# Patient Record
Sex: Female | Born: 1995 | Race: Black or African American | Hispanic: No | Marital: Single | Smoking: Never smoker
Health system: Southern US, Community
[De-identification: ages and names within clinical notes are randomized; demographics above are authoritative.]

## PROBLEM LIST (undated history)

## (undated) DIAGNOSIS — G43909 Migraine, unspecified, not intractable, without status migrainosus: Secondary | ICD-10-CM

## (undated) DIAGNOSIS — H9325 Central auditory processing disorder: Secondary | ICD-10-CM

## (undated) HISTORY — DX: Central auditory processing disorder: H93.25

## (undated) HISTORY — DX: Migraine, unspecified, not intractable, without status migrainosus: G43.909

## (undated) HISTORY — PX: NO PAST SURGERIES: SHX2092

---
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
## 2016-11-15 ENCOUNTER — Encounter: Payer: Self-pay | Admitting: Nurse Practitioner

## 2016-11-15 ENCOUNTER — Ambulatory Visit (INDEPENDENT_AMBULATORY_CARE_PROVIDER_SITE_OTHER): Payer: Self-pay | Admitting: Nurse Practitioner

## 2016-11-15 VITALS — Temp 98.5°F | Ht 65.25 in | Wt 163.0 lb

## 2016-11-15 DIAGNOSIS — M2669 Other specified disorders of temporomandibular joint: Secondary | ICD-10-CM

## 2016-11-15 NOTE — Progress Notes (Signed)
Subjective:  Presents for complaints of left ear pain that began 4 days ago. No fever or drainage. No cough runny nose headache or sore throat. Does have occasional popping in the left jaw area at times especially with yawning.  Objective:   Temp 98.5 F (36.9 C) (Oral)   Ht 5' 5.25" (1.657 m)   Wt 163 lb (73.9 kg)   BMI 26.92 kg/m  Both TMs have mild clear effusion, no erythema. No drainage in the left ear canal. Mild tenderness with palpation of the left TMJ area, no active popping with opening and closing of the mouth.  Assessment:  TMJ inflammation    Plan:    OTC anti-inflammatory as directed. OTC antihistamine and steroid nasal spray as directed. Given information on TMJ. Call back if worsens or persists.

## 2016-11-15 NOTE — Patient Instructions (Addendum)
OTC antihistamine   Temporomandibular Joint Syndrome Temporomandibular joint (TMJ) syndrome is a condition that affects the joints between your jaw and your skull. The TMJs are located near your ears and allow your jaw to open and close. These joints and the nearby muscles are involved in all movements of the jaw. People with TMJ syndrome have pain in the area of these joints and muscles. Chewing, biting, or other movements of the jaw can be difficult or painful. TMJ syndrome can be caused by various things. In many cases, the condition is mild and goes away within a few weeks. For some people, the condition can become a long-term problem. What are the causes? Possible causes of TMJ syndrome include:  Grinding your teeth or clenching your jaw. Some people do this when they are under stress.  Arthritis.  Injury to the jaw.  Head or neck injury.  Teeth or dentures that are not aligned well. In some cases, the cause of TMJ syndrome may not be known. What are the signs or symptoms? The most common symptom is an aching pain on the side of the head in the area of the TMJ. Other symptoms may include:  Pain when moving your jaw, such as when chewing or biting.  Being unable to open your jaw all the way.  Making a clicking sound when you open your mouth.  Headache.  Earache.  Neck or shoulder pain. How is this diagnosed? Diagnosis can usually be made based on your symptoms, your medical history, and a physical exam. Your health care provider may check the range of motion of your jaw. Imaging tests, such as X-rays or an MRI, are sometimes done. You may need to see your dentist to determine if your teeth and jaw are lined up correctly. How is this treated? TMJ syndrome often goes away on its own. If treatment is needed, the options may include:  Eating soft foods and applying ice or heat.  Medicines to relieve pain or inflammation.  Medicines to relax the muscles.  A splint, bite  plate, or mouthpiece to prevent teeth grinding or jaw clenching.  Relaxation techniques or counseling to help reduce stress.  Transcutaneous electrical nerve stimulation (TENS). This helps to relieve pain by applying an electrical current through the skin.  Acupuncture. This is sometimes helpful to relieve pain.  Jaw surgery. This is rarely needed. Follow these instructions at home:  Take medicines only as directed by your health care provider.  Eat a soft diet if you are having trouble chewing.  Apply ice to the painful area.  Put ice in a plastic bag.  Place a towel between your skin and the bag.  Leave the ice on for 20 minutes, 2-3 times a day.  Apply a warm compress to the painful area as directed.  Massage your jaw area and perform any jaw stretching exercises as recommended by your health care provider.  If you were given a mouthpiece or bite plate, wear it as directed.  Avoid foods that require a lot of chewing. Do not chew gum.  Keep all follow-up visits as directed by your health care provider. This is important. Contact a health care provider if:  You are having trouble eating.  You have new or worsening symptoms. Get help right away if:  Your jaw locks open or closed. This information is not intended to replace advice given to you by your health care provider. Make sure you discuss any questions you have with your health care provider.  Document Released: 04/02/2001 Document Revised: 03/07/2016 Document Reviewed: 02/10/2014 Elsevier Interactive Patient Education  2017 Elsevier Inc.  Steroid nasal spray Ibuprofen or Aleve

## 2019-08-18 ENCOUNTER — Encounter: Payer: Self-pay | Admitting: Family Medicine

## 2019-08-19 ENCOUNTER — Encounter: Payer: Self-pay | Admitting: Family Medicine

## 2020-12-21 ENCOUNTER — Ambulatory Visit
Admission: RE | Admit: 2020-12-21 | Discharge: 2020-12-21 | Disposition: A | Discharge status: Home / Self Care | Payer: Self-pay | Source: Ambulatory Visit

## 2020-12-21 ENCOUNTER — Other Ambulatory Visit: Payer: Self-pay

## 2020-12-21 VITALS — BP 130/78 | HR 96 | Temp 99.2°F | Resp 16

## 2020-12-21 DIAGNOSIS — S8391XA Sprain of unspecified site of right knee, initial encounter: Secondary | ICD-10-CM

## 2020-12-21 MED ORDER — IBUPROFEN 800 MG PO TABS: 800.0000 mg | ORAL_TABLET | Freq: Three times a day (TID) | ORAL | 0 refills | Status: DC

## 2020-12-21 NOTE — Discharge Instructions (Signed)
Ibuprofen and Tylenol for pain Ice and elevate Wear knee brace pressure support Monitor for gradual resolution of the next week Follow-up with sports medicine if persisting

## 2020-12-21 NOTE — ED Triage Notes (Signed)
Patient presents to Urgent Care with complaints of a fall of a ladder Friday. She states she heard a popping sound in right knee. She states pain increases with movement and when adding weight to right leg. She states pain has not resolved and concerned with possible fracture. Treating pain with ibuprofen and muscle relaxer. She has also applied a knee brace.

## 2020-12-21 NOTE — ED Provider Notes (Signed)
[facility]    CSN: [id number] Arrival date & time: 12/21/20  1648      History   Chief Complaint Chief Complaint  Patient presents with  . Leg Injury    Right     HPI Chelsea Serrano is a 25 y.o. female history of migraines presenting today for evaluation of right leg injury.  Reports 1 week ago fell off of a ladder, felt as if her knee dislocated and then relocated.  Since she has had continued discomfort, but has felt symptoms have been improving with wearing brace resting icing and anti-inflammatories.  She does report prior history of sprains.  Denies any repeat sensations of instability, popping catching or tearing.  HPI  Past Medical History:  Diagnosis Date  . Auditory processing disorder    central auditory processing disorder  . Migraine     Patient Active Problem List   Diagnosis Date Noted  . 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 CLOS FRACTURE MID/PROXIMAL PHALANX/PHALANG HAND 06/06/2010    History reviewed. No pertinent surgical history.  OB History   No obstetric history on file.      Home Medications    Prior to Admission medications   Medication Sig Start Date End Date Taking? Authorizing Provider  ibuprofen (ADVIL) 800 MG tablet Take 1 tablet (800 mg total) by mouth 3 (three) times daily. 12/21/20  Yes Lynleigh Kovack C, PA-C  neomycin-polymyxin-hydrocortisone (CORTISPORIN) 3.5-10000-1 otic suspension Place 4 drops into the left ear 3 (three) times daily. For 5 days Patient not taking: No sig reported 11/21/15   Merlyn Albert, MD  SPRINTEC 28 0.25-35 MG-MCG tablet Take 1 tablet by mouth daily. 10/30/20   [provider]    Family History History reviewed. No pertinent family history.  Social History Social History   Tobacco Use  . Smoking status: Never Smoker  . Smokeless tobacco: Never Used  Substance Use Topics  . Alcohol use: No  . Drug use: No     Allergies   Patient has no known allergies.   Review of  Systems Review of Systems  Constitutional: Negative for fatigue and fever.  HENT: Negative for mouth sores.   Eyes: Negative for visual disturbance.  Respiratory: Negative for shortness of breath.   Cardiovascular: Negative for chest pain.  Gastrointestinal: Negative for abdominal pain, nausea and vomiting.  Genitourinary: Negative for genital sores.  Musculoskeletal: Positive for arthralgias and joint swelling.  Skin: Negative for color change, rash and wound.  Neurological: Negative for dizziness, weakness, light-headedness and headaches.     Physical Exam Triage Vital Signs ED Triage Vitals  Enc Vitals Group     BP      Pulse      Resp      Temp      Temp src      SpO2      Weight      Height      Head Circumference      Peak Flow      Pain Score      Pain Loc      Pain Edu?      Excl. in GC?    No data found.  Updated Vital Signs BP 130/78 (BP Location: Left Arm)   Pulse 96   Temp 99.2 F (37.3 C) (Oral)   Resp 16   LMP  (Within Weeks) Comment: Period about 3 weeks ago   SpO2 97%   Visual Acuity Right Eye Distance:   Left Eye  Distance:   Bilateral Distance:    Right Eye Near:   Left Eye Near:    Bilateral Near:     Physical Exam Vitals and nursing note reviewed.  Constitutional:      Appearance: She is well-developed.     Comments: No acute distress  HENT:     Head: Normocephalic and atraumatic.     Nose: Nose normal.  Eyes:     Conjunctiva/sclera: Conjunctivae normal.  Cardiovascular:     Rate and Rhythm: Normal rate.  Pulmonary:     Effort: Pulmonary effort is normal. No respiratory distress.  Abdominal:     General: There is no distension.  Musculoskeletal:        General: Normal range of motion.     Cervical back: Neck supple.     Comments: Right knee, suprapatellar swelling noted, tender to palpation in infrapatellar area, nontender to medial and lateral joint line, nontender directly over patella, no pain with manipulation of patella,  full active range of motion, no crepitus palpated, no laxity appreciated with varus valgus stress, negative Lachman's, negative McMurray's  Skin:    General: Skin is warm and dry.  Neurological:     Mental Status: She is alert and oriented to person, place, and time.      UC Treatments / Results  Labs (all labs ordered are listed, but only abnormal results are displayed) Labs Reviewed - No data to display  EKG   Radiology No results found.  Procedures Procedures (including critical care time)  Medications Ordered in UC Medications - No data to display  Initial Impression / Assessment and Plan / UC Course  I have reviewed the triage vital signs and the nursing notes.  Pertinent labs & imaging results that were available during my care of the patient were reviewed by me and considered in my medical decision making (see chart for details).     Knee injury 1 week out from planting and twisting injury, did express concern for possible underlying soft tissue injury given mechanism of injury, offered x-ray, patient opted to defer as she has noticed some improvement and has low suspicion of fracture.  We will continue conservative treatment per patient request and continued monitoring.  Encouraged follow-up with sports medicine if symptoms persisting.  Work note provided.   Discussed strict return precautions. Patient verbalized understanding and is agreeable with plan.  Final Clinical Impressions(s) / UC Diagnoses   Final diagnoses:  Sprain of right knee, unspecified ligament, initial encounter     Discharge Instructions     Ibuprofen and Tylenol for pain Ice and elevate Wear knee brace pressure support Monitor for gradual resolution of the next week Follow-up with sports medicine if persisting    ED Prescriptions    Medication Sig Dispense Auth. Provider   ibuprofen (ADVIL) 800 MG tablet Take 1 tablet (800 mg total) by mouth 3 (three) times daily. 21 tablet Aqsa Sensabaugh,  [place] C, PA-C     PDMP not reviewed this encounter.   Lew Dawes, [place] 12/21/20 [phone]

## 2021-01-03 ENCOUNTER — Other Ambulatory Visit: Payer: Self-pay

## 2021-01-03 ENCOUNTER — Ambulatory Visit (INDEPENDENT_AMBULATORY_CARE_PROVIDER_SITE_OTHER): Payer: Self-pay | Admitting: Family Medicine

## 2021-01-03 DIAGNOSIS — M25561 Pain in right knee: Secondary | ICD-10-CM

## 2021-01-03 NOTE — Progress Notes (Signed)
    SUBJECTIVE:   CHIEF COMPLAINT / HPI:   Right Knee Pain Chelsea Serrano is a very pleasant 24y/o female who presents today with right knee pain. She states she was coming down a ladder at work, lost her footing and stepped funny and twisted her knee. She did not have immediate pain and could walk on it at first. Later that day she began having knee pain and felt like it "dislocated". This occurred about 3 weeks ago. She feels pain if she stands on it for long periods of time and it gets sore. She states when she ices it it feels stiff and hard to move. She denies any locking, catching, or popping sensations. She also has pain with knee extension. Overall, she feels like it is getting better. She takes Ibuprofen and it does help her but runs out.  PERTINENT  PMH / PSH: Hx of patellar instability 2012  OBJECTIVE:   BP 110/72   Ht 5\' 5"  (1.651 m)   Wt 145 lb (65.8 kg)   BMI 24.13 kg/m   No flowsheet data found.  Knee, Right: Inspection was negative for erythema, ecchymosis, and positive for mild swelling above the patella. No obvious bony abnormalities or signs of osteophyte development. Palpation yielded no asymmetric warmth; No joint line tenderness; No condyle tenderness; Positive patellar tenderness at insertion on tibia; No patellar crepitus. Patellar and quadriceps tendons unremarkable, and no tenderness of the pes anserine bursa. ROM normal in flexion (135 degrees) and extension (0 degrees). Normal hamstring and quadriceps strength. Neurovascularly intact bilaterally. Special Tests  - Cruciate Ligaments:   - Anterior Drawer:  NEG - Posterior Drawer: NEG   - Lachman:  NEG  - Collateral Ligaments:   - Varus/Valgus Stress test: NEG  - Meniscus:   - Thessaly: NEG   - McMurray's: NEG  - Patella:   - Patellar grind/compression: NEG   - Patellar glide: With apprehension   Limited MSK U/S Right Knee No suprapatellar pouch effusion. Quad tendon intact with no hypoechoic changes.  Patella tendon intact with no hypoechoic changes, no inflammation of the prepatellar bursa. No medial or lateral changes suggesting meniscal tear. Impression: Normal U/S of the right knee  ASSESSMENT/PLAN:   Right knee pain Given history, presentation, exam, and ultrasound today she most likely has suffered from patella subluxation. She also has some TTP at the insertion of the patellar tendon suggesting mild patellar tendonitis. - Referral to formal PT to learn HEP  - Cont Ibuprofen 200mg  3-4 tablets 3 times per day as needed for pain - Knee compression sleeve when at work and active - Work restrictions until f/u in 3 weeks: no climibing ladders, infrequent bending, stopping, or squatting. No lifting over 20lbs - F/u in 3 weeks and if little to no improvement obtain knee x-rays; 4 view (AP, Lateral, Sunrise, and )     , DO PGY-4, Sports Medicine Fellow [facility]  Addendum:  I was the preceptor for this visit and available for immediate consultation.  Arlyce Harman MD [facility]

## 2021-01-03 NOTE — Patient Instructions (Addendum)
It was great to meet you today! Thank you for letting me participate in your care!  Today, we discussed your right knee pain which is due to you having suffered from a patella subluxation. Please attend at least one to two physical therapy session to learn proper exercises to allow this to get better. While you are at work or active please wear a knee sleeve/brace. You can continue taking Ibuprofen 200mg  for pain as needed (3-4 tablets 3 times per day) and you can also use Voltaren Gel as needed. Please return in 3 weeks so we can check on your progress.  Patellar Dislocation and Subluxation The kneecap (patella) is located in a groove at the end of the thigh bone (femur). Patellar dislocation and patellar subluxation are injuries that happen when the patella slips out of its normal position. In a patellar subluxation, the patella slips partly out of the groove. In a patellar dislocation, the patella slips all the way out of the groove. What are the causes? This condition may be caused by: A hit to the knee. Twisting the knee when the foot is planted. What increases the risk? You are more likely to develop this condition if you: Are an athlete in your teens or 81s. Have had this condition before. Play certain kinds of sports, including sports that: Include quick turns, quick changes in direction, or contact with other players, like soccer. Require jumping, such as basketball or volleyball. Require that cleats are worn. What are the signs or symptoms? Symptoms of this condition include: A feeling that the knee is buckling, followed by sudden extreme pain in the knee. A deformed knee. A popping sensation, followed by a feeling that something is out of place. Inability to bend or straighten the knee. Swelling in the knee. How is this diagnosed? This condition may be diagnosed with: A physical exam. An X-ray. This may be done to see the position of the patella or to see if a bone has  broken. An MRI. This may be done to look at the alignment of your knee and the ligaments that hold your patella in place. How is this treated? Your patella may move back into place on its own when you straighten your knee. If your patella does not move back into place on its own, your health careprovider will move it back into place. After your patella is back in its normal position, you may be able to go back to your normal activities, or you may be treated further by: Wearing a knee brace to keep your knee from moving (immobilized) while it heals. Doing exercises that help improve strength and movement in your knee. Taking medicine to help with pain and inflammation. Applying ice to the knee to help with pain and inflammation. Having surgery to prevent the patella from slipping out of place or to clean out any loose cartilage in your joint. This may be needed if other treatments do not help or if the condition keeps happening. Follow these instructions at home: If you have a brace: Wear the brace as told by your health care provider. Remove it only as told by your health care provider. Loosen the brace if your toes tingle, become numb, or turn cold and blue. Keep the brace clean. If the brace is not waterproof: Do not let it get wet. Cover it with a watertight covering when you take a bath or shower. Managing pain, stiffness, and swelling  If directed, put ice on the affected area. If  you have a removable brace, remove it as told by your health care provider. Put ice in a plastic bag. Place a towel between your skin and the bag. Leave the ice on for 20 minutes, 2-3 times a day. Move your toes often to reduce stiffness and swelling. Raise (elevate) the injured area above the level of your heart while you are sitting or lying down.  Activity Do not use the injured limb to support your body weight until your health care provider says that you can. Use crutches as told by your health care  provider. Return to your normal activities as told by your health care provider. Ask your health care provider what activities are safe for you. Do exercises as told by your health care provider. General instructions Take over-the-counter and prescription medicines only as told by your health care provider. Do not use any products that contain nicotine or tobacco, such as cigarettes, e-cigarettes, and chewing tobacco. These can delay healing. If you need help quitting, ask your health care provider. Keep all follow-up visits as told by your health care provider. This is important. How is this prevented? Warm up and stretch before being active. Cool down and stretch after being active. Give your body time to rest between periods of activity. Make sure to use equipment that fits you. Be safe and responsible while being active. This will help you avoid falls. Do at least 150 minutes of moderate-intensity exercise each week, such as brisk walking or water aerobics. Maintain physical fitness, including: Strength. Flexibility. Cardiovascular fitness. Endurance. Contact a health care provider if: The pain in your knee gets worse and is not relieved by medicine. Your knee catches or locks. Get help right away if: Your patella slips out of its normal position again. The swelling in your knee gets worse. Summary Patellar dislocation and patellar subluxation are injuries that happen when the patella slips out of its normal position. If your patella does not move back into place on its own, your health care provider will move it back into place. Return to your normal activities as told by your health care provider. Ask your health care provider what activities are safe for you. Do not use the injured limb to support your body weight until your health care provider says that you can. Use crutches as told by your health care provider. Keep all follow-up visits as told by your health care provider.  This is important. This information is not intended to replace advice given to you by your health care provider. Make sure you discuss any questions you have with your healthcare provider. Document Revised: 11/03/2018 Document Reviewed: 06/01/2018 Elsevier Patient Education  2022 [employer].   Be well, Jules Schick, DO PGY-4, Sports Medicine Fellow [facility]

## 2021-01-03 NOTE — Assessment & Plan Note (Signed)
Given history, presentation, exam, and ultrasound today she most likely has suffered from patella subluxation. She also has some TTP at the insertion of the patellar tendon suggesting mild patellar tendonitis. - Referral to formal PT to learn HEP  - Cont Ibuprofen 200mg  3-4 tablets 3 times per day as needed for pain - Knee compression sleeve when at work and active - Work restrictions until f/u in 3 weeks: no climibing ladders, infrequent bending, stopping, or squatting. No lifting over 20lbs - F/u in 3 weeks and if little to no improvement obtain knee x-rays; 4 view (AP, Lateral, Sunrise, and [place])

## 2021-01-24 ENCOUNTER — Ambulatory Visit (INDEPENDENT_AMBULATORY_CARE_PROVIDER_SITE_OTHER): Payer: Self-pay | Admitting: Family Medicine

## 2021-01-24 ENCOUNTER — Other Ambulatory Visit: Payer: Self-pay

## 2021-01-24 VITALS — BP 102/62 | Ht 65.0 in | Wt 145.0 lb

## 2021-01-24 DIAGNOSIS — M25561 Pain in right knee: Secondary | ICD-10-CM

## 2021-01-24 NOTE — Assessment & Plan Note (Addendum)
Improving, but continues to have pain and instability.  She is improving with PT and rest, but does not feel that she is ready to return to full activity at work.  Will continue to have her use compression sleeve, ibuprofen PRN, can transition to home exercises when she is comfortable.  Continue modified working.  F/U 1 month or sooner as needed.

## 2021-01-24 NOTE — Patient Instructions (Signed)
Thank you for coming to see me today. It was a pleasure. Today we talked about:   Continue using your brace.  When you feel comfortable doing your exercises at home, you can stop going to PT.  Continue to use ibuprofen as needed.  Please follow-up with [place] in 1 month or sooner as needed.  If you have any questions or concerns, please do not hesitate to call the office.  Best,   Luis Abed, DO

## 2021-01-24 NOTE — Progress Notes (Signed)
   Chelsea Serrano is a 25 y.o. female who presents to [facility] today for the following:  Follow-up right knee pain Last seen on 6/15, diagnosed with patellar subluxation Referred to PT advised to use ibuprofen with knee compression sleeve Has been going to PT starting last week for 3 times and states that is helping some She reports that her stiffness is improved Does state that she still has pain at the inferior aspect of her patella over her patellar tendon but this is improving She has had work restrictions and has just been sitting now, which does not cause her any pain Has been taking ibuprofen 600 mg, but only requires this when she has pain which is every few days She is also icing Reports that she has some swelling after PT Has not reinjured Does still feel that she has some instability of her knee but has been wearing her brace daily   PMH reviewed.  ROS as above. Medications reviewed.  Exam:  BP 102/62   Ht 5\' 5"  (1.651 m)   Wt 145 lb (65.8 kg)   BMI 24.13 kg/m  Gen: Well NAD MSK:  Right Knee: - Inspection: no gross deformity b/l. No swelling/effusion, erythema or bruising b/l. Skin intact - Palpation: TTP along patellar tendon - ROM: full active ROM with flexion and extension in knee and hip b/l - Strength: 5/5 strength b/l - Neuro/vasc: NV intact - Special Tests: - LIGAMENTS: negative Lachman's, no MCL or LCL laxity  -- MENISCUS: negative McMurray's -- PF JOINT: increased patellar mobility on right, normal patellar mobility on left.  negative patellar grind, positive patellar apprehension on right  Hips: normal ROM b/l   No results found.   Assessment and Plan: 1) Acute pain of right knee Improving, but continues to have pain and instability.  She is improving with PT and rest, but does not feel that she is ready to return to full activity at work.  Will continue to have her use compression sleeve, ibuprofen PRN, can transition to home exercises when she is  comfortable.  Continue modified working.  F/U 1 month or sooner as needed.   , D.O.  PGY-4 [facility]  01/24/2021 2:19 PM

## 2021-01-25 ENCOUNTER — Encounter: Payer: Self-pay | Admitting: Family Medicine

## 2021-02-26 ENCOUNTER — Ambulatory Visit: Payer: Medicaid Other | Admitting: Family Medicine

## 2021-03-09 ENCOUNTER — Other Ambulatory Visit: Payer: Self-pay

## 2021-03-09 ENCOUNTER — Ambulatory Visit (INDEPENDENT_AMBULATORY_CARE_PROVIDER_SITE_OTHER): Payer: Self-pay | Admitting: Family Medicine

## 2021-03-09 VITALS — Ht 65.0 in | Wt 145.0 lb

## 2021-03-09 DIAGNOSIS — M2142 Flat foot [pes planus] (acquired), left foot: Secondary | ICD-10-CM

## 2021-03-09 DIAGNOSIS — M2140 Flat foot [pes planus] (acquired), unspecified foot: Secondary | ICD-10-CM

## 2021-03-09 DIAGNOSIS — M2141 Flat foot [pes planus] (acquired), right foot: Secondary | ICD-10-CM

## 2021-03-09 DIAGNOSIS — M25561 Pain in right knee: Secondary | ICD-10-CM

## 2021-03-09 HISTORY — DX: Flat foot (pes planus) (acquired), unspecified foot: M21.40

## 2021-03-09 NOTE — Patient Instructions (Signed)
Thank you for coming to see me today. It was a pleasure. Today we talked about:   We will give you strengthening exercises to continue.  We will have you continue to wear your brace at work as needed.  You can try the insoles and see if these help.  Please follow-up with [place] in 6-8 weeks as needed.  If you have any questions or concerns, please do not hesitate to call the office at [phone].  Best,   Luis Abed, DO [facility]

## 2021-03-09 NOTE — Progress Notes (Signed)
[facility]: Attending Note: I have reviewed the chart, discussed wit the Sports Medicine Fellow. I agree with assessment and treatment plan as detailed in the Fellow's note. I am glad she discontinued her exercises.  She still has limited quadricep development so I would add short arc quadricep exercises and we have given her handout for that.  She can follow-up as needed.

## 2021-03-09 NOTE — Progress Notes (Signed)
   Chelsea Serrano is a 24 y.o. female who presents to [facility] today for the following:  Right knee pain follow-up Last seen on 7/6 for the same Previously diagnosed with patellar subluxation, initial injury occurred in May She has been discharged from physical therapy and is performing home exercises Overall she is doing well She reports that she has been doing her home exercises and feels that they are going well She states that she does not have much pain other than when she is at work and standing for long period time Still has some soreness around her right kneecap She states that she does have pain in her feet when she is standing for long periods of time and feels that she has not found shoes that help both her knees and her feet  PMH reviewed.  ROS as above. Medications reviewed.  Exam:  Ht 5\' 5"  (1.651 m)   Wt 145 lb (65.8 kg)   BMI 24.13 kg/m  Gen: Well NAD MSK:  Right Knee: - Inspection: no gross deformity b/l. No swelling/effusion, erythema or bruising b/l. Skin intact - Palpation: no TTP b/l - ROM: full active ROM with flexion and extension in knee and hip b/l.  - Strength: 5/5 strength b/l - Neuro/vasc: NV intact distally b/l - Special Tests: - LIGAMENTS: negative anterior and posterior drawer, negative Lachman's, no MCL or LCL laxity  -- MENISCUS: negative McMurray's -- PF JOINT: Slight hypermobility of right patella compared to left patella, no pain.  equivocal patellar grind on right, negative patellar apprehension  Hips: normal ROM  Feet: Bilateral pes planus, no navicular drop   No results found.   Assessment and Plan: 1) Acute pain of right knee Overall symptoms are improving.  She has excellent strength on her examination.  We will have her continue performing exercises and also add short arc quad exercises.  She can continue to wear her brace as needed while she is working.  She is no longer wearing it at home and feels okay with this.  Can have her  follow-up here in 6 to 8 weeks as needed.  Flat foot Given her pes planus on exam and report of foot pain previously, discussed sports insoles with scaphoid padding which she opted to proceed with.  She was given size 9-10 women's Hapad with small scaphoid pads.  She was able to ambulate in the hallway with good alignment.  Discussed that she could return for further changes if needed or come back for custom orthotics if desires.  Can follow-up as needed.   , D.O.  PGY-4 [facility]  03/09/2021 11:56 AM

## 2021-03-09 NOTE — Assessment & Plan Note (Signed)
Given her pes planus on exam and report of foot pain previously, discussed sports insoles with scaphoid padding which she opted to proceed with.  She was given size 9-10 women's Hapad with small scaphoid pads.  She was able to ambulate in the hallway with good alignment.  Discussed that she could return for further changes if needed or come back for custom orthotics if desires.  Can follow-up as needed.

## 2021-03-09 NOTE — Assessment & Plan Note (Signed)
Overall symptoms are improving.  She has excellent strength on her examination.  We will have her continue performing exercises and also add short arc quad exercises.  She can continue to wear her brace as needed while she is working.  She is no longer wearing it at home and feels okay with this.  Can have her follow-up here in 6 to 8 weeks as needed.

## 2021-03-31 ENCOUNTER — Encounter: Payer: Self-pay | Admitting: Emergency Medicine

## 2021-03-31 ENCOUNTER — Ambulatory Visit
Admission: EM | Admit: 2021-03-31 | Discharge: 2021-03-31 | Disposition: A | Discharge status: Home / Self Care | Payer: Medicaid Other | Attending: Emergency Medicine | Admitting: Emergency Medicine

## 2021-03-31 DIAGNOSIS — N898 Other specified noninflammatory disorders of vagina: Secondary | ICD-10-CM | POA: Insufficient documentation

## 2021-03-31 MED ORDER — VALACYCLOVIR HCL 1 G PO TABS: 1000.0000 mg | ORAL_TABLET | Freq: Two times a day (BID) | ORAL | 0 refills | Status: DC

## 2021-03-31 NOTE — Discharge Instructions (Signed)
HSV swab obtained Vaginal self-swab obtained.   We will follow up with you regarding abnormal results HIV/ syphilis testing today Valtrex prescribed.  Take as directed and to completion If tests results are positive, please abstain from sexual activity until you and your partner(s) have been treated Follow up with PCP for recheck and to ensure symptoms are improving Return here or go to ER if you have any new or worsening symptoms fever, chills, nausea, vomiting, abdominal or pelvic pain, painful intercourse, vaginal discharge, vaginal bleeding, persistent symptoms despite treatment, etc..Marland Kitchen

## 2021-03-31 NOTE — ED Provider Notes (Signed)
[facility]   [id number] 03/31/21 Arrival Time: 5784   ON:GEXBMWU sore  SUBJECTIVE:  Chelsea Serrano is a 25 y.o. female who presents with complaints of vaginal sore x 3 days.  Last sex 9-10 days ago with new partner.  Used a condom.  Partner without symptoms.  She reports similar symptoms in the past and diagnosed with "clogged duct."  Denies hx of STDs.  She denies fever, chills, nausea, vomiting, abdominal or pelvic pain, urinary symptoms, vaginal itching, vaginal odor, vaginal bleeding, dyspareunia.  Patient's last menstrual period was 03/21/2021 (exact date).   ROS: As per HPI.  All other pertinent ROS negative.     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   Migraine    History reviewed. No pertinent surgical history. No Known Allergies No current facility-administered medications on file prior to encounter.   Current Outpatient Medications on File Prior to Encounter  Medication Sig Dispense Refill   ibuprofen (ADVIL) 800 MG tablet Take 1 tablet (800 mg total) by mouth 3 (three) times daily. 21 tablet 0   neomycin-polymyxin-hydrocortisone (CORTISPORIN) 3.5-10000-1 otic suspension Place 4 drops into the left ear 3 (three) times daily. For 5 days (Patient not taking: No sig reported) 10 mL 0   SPRINTEC 28 0.25-35 MG-MCG tablet Take 1 tablet by mouth daily.      Social History   Socioeconomic History   Marital status: Single    Spouse name: Not on file   Number of children: Not on file   Years of education: Not on file   Highest education level: Not on file  Occupational History   Not on file  Tobacco Use   Smoking status: Never   Smokeless tobacco: Never  Substance and Sexual Activity   Alcohol use: No   Drug use: No   Sexual activity: Not on file  Other Topics Concern   Not on file  Social History Narrative   Not on file   Social Determinants of Health   Financial Resource  Strain: Not on file  Food Insecurity: Not on file  Transportation Needs: Not on file  Physical Activity: Not on file  Stress: Not on file  Social Connections: Not on file  Intimate Partner Violence: Not on file   No family history on file.  OBJECTIVE:  Vitals:   03/31/21 0835  BP: 124/79  Pulse: 90  Resp: 17  Temp: 97.6 F (36.4 C)  TempSrc: Temporal  SpO2: 98%     General appearance: Alert, NAD, appears stated age Head: NCAT Eyes: EOMI grossly Lungs: normal respiratory effort GU: External examination positive for yellow lesion with erythematous outline on left labia minora, mildly TTP, no obvious discharge Skin: warm and dry Psychological:  Alert and cooperative. Normal mood and affect.   ASSESSMENT & PLAN:  1. Vaginal sore     Meds ordered this encounter  Medications   valACYclovir (VALTREX) 1000 MG tablet    Sig: Take 1 tablet (1,000 mg total) by mouth 2 (two) times daily for 10 days.    Dispense:  20 tablet    Refill:  0    Order Specific Question:   Supervising Provider    Answer:   Eustace Moore [[id number]]     Pending: Labs Reviewed  HSV CULTURE AND TYPING  RPR  HIV ANTIBODY (ROUTINE TESTING W REFLEX)  CERVICOVAGINAL ANCILLARY ONLY   HSV swab obtained Vaginal self-swab obtained.   We will  follow up with you regarding abnormal results HIV/ syphilis testing today Valtrex prescribed.  Take as directed and to completion If tests results are positive, please abstain from sexual activity until you and your partner(s) have been treated Follow up with PCP for recheck and to ensure symptoms are improving Return here or go to ER if you have any new or worsening symptoms fever, chills, nausea, vomiting, abdominal or pelvic pain, painful intercourse, vaginal discharge, vaginal bleeding, persistent symptoms despite treatment, etc...  Reviewed expectations re: course of current medical issues. Questions answered. Outlined signs and symptoms indicating need  for more acute intervention. Patient verbalized understanding. After Visit Summary given.        Rennis Harding, PA-C 03/31/21 [phone]

## 2021-03-31 NOTE — ED Triage Notes (Signed)
Pt states she had a bump come up on her LT labia a few days ago and now it has turned into a rash.  States it is painful to touch.

## 2021-04-01 LAB — HIV ANTIBODY (ROUTINE TESTING W REFLEX): HIV Screen 4th Generation wRfx: NONREACTIVE

## 2021-04-01 LAB — RPR: RPR Ser Ql: NONREACTIVE

## 2021-04-02 LAB — CERVICOVAGINAL ANCILLARY ONLY
Bacterial Vaginitis (gardnerella): NEGATIVE
Candida Glabrata: NEGATIVE
Candida Vaginitis: NEGATIVE
Chlamydia: NEGATIVE
Comment: NEGATIVE
Comment: NEGATIVE
Comment: NEGATIVE
Comment: NEGATIVE
Comment: NEGATIVE
Comment: NORMAL
Neisseria Gonorrhea: NEGATIVE
Trichomonas: NEGATIVE

## 2021-04-03 LAB — HSV CULTURE AND TYPING

## 2021-04-10 ENCOUNTER — Telehealth: Payer: Self-pay | Admitting: Emergency Medicine

## 2021-04-10 ENCOUNTER — Other Ambulatory Visit: Payer: Self-pay

## 2021-04-10 ENCOUNTER — Ambulatory Visit
Admission: RE | Admit: 2021-04-10 | Discharge: 2021-04-10 | Disposition: A | Discharge status: Home / Self Care | Payer: Medicaid Other | Source: Ambulatory Visit

## 2021-04-10 MED ORDER — VALACYCLOVIR HCL 1 G PO TABS: 1000.0000 mg | ORAL_TABLET | Freq: Two times a day (BID) | ORAL | 0 refills | Status: DC

## 2021-04-10 NOTE — Telephone Encounter (Signed)
Needs refill on hsv medications, has appt with primary care next week.

## 2021-04-10 NOTE — ED Triage Notes (Signed)
Pt needs refill on hsv meds

## 2021-04-20 ENCOUNTER — Other Ambulatory Visit: Payer: Self-pay

## 2021-04-20 ENCOUNTER — Encounter: Payer: Self-pay | Admitting: Nurse Practitioner

## 2021-04-20 ENCOUNTER — Ambulatory Visit: Payer: Self-pay | Admitting: Nurse Practitioner

## 2021-04-20 VITALS — BP 114/73 | HR 80 | Temp 98.5°F | Ht 65.0 in | Wt 145.0 lb

## 2021-04-20 DIAGNOSIS — B009 Herpesviral infection, unspecified: Secondary | ICD-10-CM

## 2021-04-20 DIAGNOSIS — Z0001 Encounter for general adult medical examination with abnormal findings: Secondary | ICD-10-CM | POA: Insufficient documentation

## 2021-04-20 DIAGNOSIS — Z7689 Persons encountering health services in other specified circumstances: Secondary | ICD-10-CM

## 2021-04-20 DIAGNOSIS — Z139 Encounter for screening, unspecified: Secondary | ICD-10-CM

## 2021-04-20 DIAGNOSIS — S3660XA Unspecified injury of rectum, initial encounter: Secondary | ICD-10-CM

## 2021-04-20 DIAGNOSIS — Z23 Encounter for immunization: Secondary | ICD-10-CM

## 2021-04-20 MED ORDER — ZINC OXIDE 40 % EX PSTE: PASTE | CUTANEOUS | 1 refills | Status: AC

## 2021-04-20 MED ORDER — VALACYCLOVIR HCL 1 G PO TABS: 1000.0000 mg | ORAL_TABLET | Freq: Two times a day (BID) | ORAL | 0 refills | Status: AC

## 2021-04-20 MED ORDER — VALACYCLOVIR HCL 500 MG PO TABS: 500.0000 mg | ORAL_TABLET | Freq: Two times a day (BID) | ORAL | 5 refills | Status: AC

## 2021-04-20 NOTE — Patient Instructions (Signed)
Please have fasting labs drawn 2-3 days prior to your appointment so we can discuss the results during your office visit.  

## 2021-04-20 NOTE — Progress Notes (Signed)
New Patient Office Visit  Subjective:  Patient ID: Chelsea Serrano, female    DOB: 1996/07/19  Age: 25 y.o. MRN: [id number]  CC:  Chief Complaint  Patient presents with   New Patient (Initial Visit)    Establish care recently diagnosed with herpes     HPI Chelsea Serrano presents for new patient visit. No recent PCP, physical, or labs  She was recently diagnosed with HSV-2, and she has sores in her her vagina that are doing better since starting valtrex, but she has some sores near her rectum that she states are getting worse.    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   CLOS FRACTURE MID/PROXIMAL PHALANX/PHALANG HAND 06/06/2010   Qualifier: Diagnosis of  By: Aline Brochure MD, Stanley     Flat foot 03/09/2021   Migraine     Past Surgical History:  Procedure Laterality Date   NO PAST SURGERIES      Family History  Problem Relation Age of Onset   Cancer Maternal Grandmother    Cancer Paternal Grandmother     Social History   Socioeconomic History   Marital status: Single    Spouse name: Not on file   Number of children: 0   Years of education: Not on file   Highest education level: Not on file  Occupational History   Occupation: Therapist, 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  Tobacco Use   Smoking status: Never   Smokeless tobacco: Never  Vaping Use   Vaping Use: Never used  Substance and Sexual Activity   Alcohol use: No   Drug use: No   Sexual activity: Yes    Birth control/protection: Pill  Other Topics Concern   Not on file  Social History Narrative   Not on file   Social Determinants of Health   Financial Resource Strain: Not on file  Food Insecurity: Not on file  Transportation Needs: Not on file  Physical Activity: Not on file  Stress: Not on file  Social Connections: Not on file  Intimate Partner Violence: Not on file    ROS Review of Systems  Constitutional: Negative.    Gastrointestinal:  Positive for rectal pain.  Genitourinary:  Positive for genital sores.       Genital sores improved since taking valtrex  Skin:  Positive for rash.   Objective:   Today's Vitals: BP 114/73 (BP Location: Right Arm, Patient Position: Sitting, Cuff Size: Normal)   Pulse 80   Temp 98.5 F (36.9 C) (Oral)   Ht 5' 5" (1.651 m)   Wt 145 lb 0.6 oz (65.8 kg)   LMP 03/21/2021 (Exact Date)   SpO2 97%   BMI 24.14 kg/m   Physical Exam Constitutional:      Appearance: Normal appearance.  Genitourinary:      Comments: Small rectal wound as marked in red above; likely a deroofed herpetic rash; pink woundbed, no sign of infection Neurological:     Mental Status: She is alert.    Assessment & Plan:   Problem List Items Addressed This Visit       Other   Establishing care with new doctor, encounter for - Primary    -no previous PCP/records      Relevant Orders   CBC with Differential/Platelet   CMP14+EGFR   Lipid Panel With LDL/HDL Ratio   Hepatitis C antibody   HSV-2 (herpes simplex virus 2) infection    -was recently  treated with valacyclovir x 10 days -she has anal sore at 1 o'clock that is healing, no sign of infection -recommend barrier cream like max strength desitin since she is having pain with BMs -Refill valtrex x1 week -if no improvement in 1 week, would consider surgical consult      Relevant Medications   valACYclovir (VALTREX) 1000 MG tablet   Other Visit Diagnoses     Injury of rectum without open wound into cavity, initial encounter       Relevant Medications   Zinc Oxide 40 % PSTE   Screening due       Relevant Orders   Hepatitis C antibody       Outpatient Encounter Medications as of 04/20/2021  Medication Sig   ibuprofen (ADVIL) 800 MG tablet Take 1 tablet (800 mg total) by mouth 3 (three) times daily.   SPRINTEC 28 0.25-35 MG-MCG tablet Take 1 tablet by mouth daily.   Zinc Oxide 40 % PSTE Apply to affected area up to TID AS  NEEDED   [DISCONTINUED] valACYclovir (VALTREX) 1000 MG tablet Take 1 tablet (1,000 mg total) by mouth 2 (two) times daily for 10 days.   valACYclovir (VALTREX) 1000 MG tablet Take 1 tablet (1,000 mg total) by mouth 2 (two) times daily for 7 days.   [DISCONTINUED] neomycin-polymyxin-hydrocortisone (CORTISPORIN) 3.5-10000-1 otic suspension Place 4 drops into the left ear 3 (three) times daily. For 5 days (Patient not taking: No sig reported)   No facility-administered encounter medications on file as of 04/20/2021.    Follow-up: Return in about 3 months (around 07/20/2021) for Physical Exam (PAP optional).   Noreene Larsson, NP

## 2021-04-20 NOTE — Addendum Note (Signed)
Addended by: Bjorn Pippin on: 04/20/2021 09:40 AM   Modules accepted: Orders

## 2021-04-20 NOTE — Assessment & Plan Note (Signed)
-  was recently treated with valacyclovir x 10 days -she has anal sore at 1 o'clock that is healing, no sign of infection -recommend barrier cream like max strength desitin since she is having pain with BMs -Refill valtrex x1 week -if no improvement in 1 week, would consider surgical consult

## 2021-04-20 NOTE — Assessment & Plan Note (Signed)
-  no previous PCP/records

## 2021-07-17 DIAGNOSIS — Z7689 Persons encountering health services in other specified circumstances: Secondary | ICD-10-CM | POA: Diagnosis not present

## 2021-07-17 DIAGNOSIS — Z139 Encounter for screening, unspecified: Secondary | ICD-10-CM | POA: Diagnosis not present

## 2021-07-18 ENCOUNTER — Other Ambulatory Visit (HOSPITAL_COMMUNITY)
Admission: RE | Admit: 2021-07-18 | Discharge: 2021-07-18 | Disposition: A | Discharge status: Home / Self Care | Payer: BC Managed Care – PPO | Source: Ambulatory Visit | Attending: Family Medicine | Admitting: Family Medicine

## 2021-07-18 ENCOUNTER — Other Ambulatory Visit: Payer: Self-pay

## 2021-07-18 ENCOUNTER — Ambulatory Visit (INDEPENDENT_AMBULATORY_CARE_PROVIDER_SITE_OTHER): Payer: BC Managed Care – PPO | Admitting: Nurse Practitioner

## 2021-07-18 ENCOUNTER — Encounter: Payer: Self-pay | Admitting: Nurse Practitioner

## 2021-07-18 VITALS — BP 126/84 | HR 85 | Ht 65.0 in | Wt 152.1 lb

## 2021-07-18 DIAGNOSIS — Z124 Encounter for screening for malignant neoplasm of cervix: Secondary | ICD-10-CM

## 2021-07-18 DIAGNOSIS — E785 Hyperlipidemia, unspecified: Secondary | ICD-10-CM | POA: Insufficient documentation

## 2021-07-18 DIAGNOSIS — H6120 Impacted cerumen, unspecified ear: Secondary | ICD-10-CM | POA: Insufficient documentation

## 2021-07-18 DIAGNOSIS — H6123 Impacted cerumen, bilateral: Secondary | ICD-10-CM | POA: Diagnosis not present

## 2021-07-18 DIAGNOSIS — Z23 Encounter for immunization: Secondary | ICD-10-CM | POA: Diagnosis not present

## 2021-07-18 DIAGNOSIS — Z0001 Encounter for general adult medical examination with abnormal findings: Secondary | ICD-10-CM

## 2021-07-18 DIAGNOSIS — E7800 Pure hypercholesterolemia, unspecified: Secondary | ICD-10-CM

## 2021-07-18 LAB — CBC WITH DIFFERENTIAL/PLATELET
Basophils Absolute: 0 10*3/uL (ref 0.0–0.2)
Basos: 1 %
EOS (ABSOLUTE): 0 10*3/uL (ref 0.0–0.4)
Eos: 0 %
Hematocrit: 40.4 % (ref 34.0–46.6)
Hemoglobin: 13.4 g/dL (ref 11.1–15.9)
Immature Grans (Abs): 0 10*3/uL (ref 0.0–0.1)
Immature Granulocytes: 0 %
Lymphocytes Absolute: 2 10*3/uL (ref 0.7–3.1)
Lymphs: 45 %
MCH: 28.5 pg (ref 26.6–33.0)
MCHC: 33.2 g/dL (ref 31.5–35.7)
MCV: 86 fL (ref 79–97)
Monocytes Absolute: 0.3 10*3/uL (ref 0.1–0.9)
Monocytes: 8 %
Neutrophils Absolute: 2.1 10*3/uL (ref 1.4–7.0)
Neutrophils: 46 %
Platelets: 154 10*3/uL (ref 150–450)
RBC: 4.7 x10E6/uL (ref 3.77–5.28)
RDW: 11.2 % — ABNORMAL LOW (ref 11.7–15.4)
WBC: 4.5 10*3/uL (ref 3.4–10.8)

## 2021-07-18 LAB — CMP14+EGFR
ALT: 7 IU/L (ref 0–32)
AST: 18 IU/L (ref 0–40)
Albumin/Globulin Ratio: 1.4 (ref 1.2–2.2)
Albumin: 4.5 g/dL (ref 3.9–5.0)
Alkaline Phosphatase: 46 IU/L (ref 44–121)
BUN/Creatinine Ratio: 13 (ref 9–23)
BUN: 9 mg/dL (ref 6–20)
Bilirubin Total: 1.4 mg/dL — ABNORMAL HIGH (ref 0.0–1.2)
CO2: 23 mmol/L (ref 20–29)
Calcium: 9.2 mg/dL (ref 8.7–10.2)
Chloride: 104 mmol/L (ref 96–106)
Creatinine, Ser: 0.71 mg/dL (ref 0.57–1.00)
Globulin, Total: 3.2 g/dL (ref 1.5–4.5)
Glucose: 79 mg/dL (ref 70–99)
Potassium: 3.9 mmol/L (ref 3.5–5.2)
Sodium: 139 mmol/L (ref 134–144)
Total Protein: 7.7 g/dL (ref 6.0–8.5)
eGFR: 122 mL/min/{1.73_m2} (ref >59)

## 2021-07-18 LAB — HEPATITIS C ANTIBODY: Hep C Virus Ab: 0.1 s/co ratio (ref 0.0–0.9)

## 2021-07-18 LAB — LIPID PANEL WITH LDL/HDL RATIO
Cholesterol, Total: 209 mg/dL — ABNORMAL HIGH (ref 100–199)
HDL: 53 mg/dL (ref >39)
LDL Chol Calc (NIH): 145 mg/dL — ABNORMAL HIGH (ref 0–99)
LDL/HDL Ratio: 2.7 ratio (ref 0.0–3.2)
Triglycerides: 62 mg/dL (ref 0–149)
VLDL Cholesterol Cal: 11 mg/dL (ref 5–40)

## 2021-07-18 MED ORDER — DEBROX 6.5 % OT SOLN
5.0000 [drp] | Freq: Two times a day (BID) | OTIC | 0 refills | Status: DC
Start: 2021-07-18 — End: 2021-07-25

## 2021-07-18 NOTE — Progress Notes (Signed)
Acute Office Visit  Subjective:    Patient ID: Chelsea Serrano, female    DOB: 03/17/1996, 25 y.o.   MRN: [id number]  Chief Complaint  Patient presents with   Annual Exam    Cpe/pap    HPI Patient is in today for physical exam.  No acute concerns.  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   Migraine     Past Surgical History:  Procedure Laterality Date   NO PAST SURGERIES      Family History  Problem Relation Age of Onset   Cancer Maternal Grandmother    Cancer Paternal Grandmother     Social History   Socioeconomic History   Marital status: Single    Spouse name: Not on file   Number of children: 0   Years of education: Not on file   Highest education level: Not on file  Occupational History   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  Tobacco Use   Smoking status: Never   Smokeless tobacco: Never  Vaping Use   Vaping Use: Never used  Substance and Sexual Activity   Alcohol use: No   Drug use: No   Sexual activity: Yes    Birth control/protection: Pill  Other Topics Concern   Not on file  Social History Narrative   Not on file   Social Determinants of Health   Financial Resource Strain: Not on file  Food Insecurity: Not on file  Transportation Needs: Not on file  Physical Activity: Not on file  Stress: Not on file  Social Connections: Not on file  Intimate Partner Violence: Not on file    Outpatient Medications Prior to Visit  Medication Sig Dispense Refill   SPRINTEC 28 0.25-35 MG-MCG tablet Take 1 tablet by mouth daily.     valACYclovir (VALTREX) 500 MG tablet Take 1 tablet (500 mg total) by mouth every 12 (twelve) hours. Take for recurrence of symptoms. Take as soon as possible within 24 hours of symptom onset. 6 tablet 5    Zinc Oxide 40 % PSTE Apply to affected area up to TID AS NEEDED 28 g 1   ibuprofen (ADVIL) 800 MG tablet Take 1 tablet (800 mg total) by mouth 3 (three) times daily. 21 tablet 0   No facility-administered medications prior to visit.    No Known Allergies  Review of Systems  Constitutional: Negative.   HENT: Negative.    Eyes: Negative.   Respiratory: Negative.    Cardiovascular: Negative.   Gastrointestinal: Negative.   Endocrine: Negative.   Genitourinary: Negative.   Musculoskeletal: Negative.   Skin: Negative.   Allergic/Immunologic: Negative.   Neurological: Negative.   Hematological: Negative.   Psychiatric/Behavioral: Negative.        Objective:    Physical Exam Exam conducted with a chaperone present Industrial/product designer chaperoned).  Constitutional:      Appearance: Normal appearance.  HENT:     Head: Normocephalic and atraumatic.     Right Ear: Ear canal and external ear normal. There is impacted cerumen.     Left Ear: Ear canal and external ear normal. There is impacted cerumen.     Nose: Nose normal.     Mouth/Throat:     Mouth: Mucous membranes are moist.     Pharynx: Oropharynx is  clear.  Eyes:     Extraocular Movements: Extraocular movements intact.     Conjunctiva/sclera: Conjunctivae normal.     Pupils: Pupils are equal, round, and reactive to light.  Cardiovascular:     Rate and Rhythm: Normal rate and regular rhythm.     Pulses: Normal pulses.     Heart sounds: Normal heart sounds.  Pulmonary:     Effort: Pulmonary effort is normal.     Breath sounds: Normal breath sounds.  Chest:     Chest wall: No mass or tenderness.  Breasts:    Right: Normal.     Left: Normal.  Abdominal:     General: Abdomen is flat. Bowel sounds are normal.     Palpations: Abdomen is soft.  Genitourinary:    General: Normal vulva.     Exam position: Lithotomy position.     Rectum: Normal.  Musculoskeletal:        General: Normal range of motion.     Cervical back: Normal  range of motion and neck supple.  Lymphadenopathy:     Upper Body:     Right upper body: No supraclavicular, axillary or pectoral adenopathy.     Left upper body: No supraclavicular, axillary or pectoral adenopathy.  Skin:    General: Skin is warm and dry.     Capillary Refill: Capillary refill takes less than 2 seconds.  Neurological:     General: No focal deficit present.     Mental Status: She is alert and oriented to person, place, and time.     Cranial Nerves: No cranial nerve deficit.     Sensory: No sensory deficit.     Motor: No weakness.     Coordination: Coordination normal.     Gait: Gait normal.  Psychiatric:        Mood and Affect: Mood normal.        Behavior: Behavior normal.        Thought Content: Thought content normal.        Judgment: Judgment normal.    BP 126/84    Pulse 85    Ht $R'5\' 5"'Qk$  (1.651 m)    Wt 152 lb 1.9 oz (69 kg)    LMP 07/18/2021    SpO2 98%    BMI 25.31 kg/m  Wt Readings from Last 3 Encounters:  07/18/21 152 lb 1.9 oz (69 kg)  04/20/21 145 lb 0.6 oz (65.8 kg)  03/09/21 145 lb (65.8 kg)    Health Maintenance Due  Topic Date Due   PAP-Cervical Cytology Screening  Never done   PAP SMEAR-Modifier  Never done   TETANUS/TDAP  02/14/2019    There are no preventive care reminders to display for this patient.   No results found for: TSH Lab Results  Component Value Date   WBC 4.5 07/17/2021   HGB 13.4 07/17/2021   HCT 40.4 07/17/2021   MCV 86 07/17/2021   PLT 154 07/17/2021   Lab Results  Component Value Date   NA 139 07/17/2021   K 3.9 07/17/2021   CO2 23 07/17/2021   GLUCOSE 79 07/17/2021   BUN 9 07/17/2021   CREATININE 0.71 07/17/2021   BILITOT 1.4 (H) 07/17/2021   ALKPHOS 46 07/17/2021   AST 18 07/17/2021   ALT 7 07/17/2021   PROT 7.7 07/17/2021   ALBUMIN 4.5 07/17/2021   CALCIUM 9.2 07/17/2021   EGFR 122 07/17/2021   Lab Results  Component Value Date   CHOL 209 (H) 07/17/2021   Lab  Results  Component Value Date    HDL 53 07/17/2021   Lab Results  Component Value Date   LDLCALC 145 (H) 07/17/2021   Lab Results  Component Value Date   TRIG 62 07/17/2021   No results found for: CHOLHDL No results found for: HGBA1C     Assessment & Plan:   Problem List Items Addressed This Visit       Nervous and Auditory   Cerumen impaction    -irrigated today -after irrigation, she was still impacted bilaterally -Rx. Debrox -repeat irrigation in 1 week      Relevant Medications   carbamide peroxide (DEBROX) 6.5 % OTIC solution     Other   Encounter for general adult medical examination with abnormal findings - Primary    -physical exam completed; reviewed screenings/immunizations with pt -PAP today      Cervical cancer screening    -PAP completed today       Relevant Orders   Cytology - PAP   Hyperbilirubinemia    -repeat labs in 1 month      Relevant Orders   CMP14+EGFR   HLD (hyperlipidemia)    -decrease fried/fatty foods -discussed statins, but she is of child-bearing age so refrain from statins for now        Meds ordered this encounter  Medications   carbamide peroxide (DEBROX) 6.5 % OTIC solution    Sig: Place 5 drops into both ears 2 (two) times daily.    Dispense:  15 mL    Refill:  0     Noreene Larsson, NP

## 2021-07-18 NOTE — Patient Instructions (Addendum)
Please have fasting labs drawn 2-3 days prior to your appointment so we can discuss the results during your office visit. ° °I will be moving to [facility] located at [address], [place], [place] [address] effective Jul 22, 2021. °If you would like to establish care with [facility] please call [phone]. °

## 2021-07-18 NOTE — Assessment & Plan Note (Signed)
-  decrease fried/fatty foods -discussed statins, but she is of child-bearing age so refrain from statins for now

## 2021-07-18 NOTE — Assessment & Plan Note (Signed)
-  repeat labs in 1 month

## 2021-07-18 NOTE — Assessment & Plan Note (Signed)
-  irrigated today -after irrigation, she was still impacted bilaterally -Rx. Debrox -repeat irrigation in 1 week

## 2021-07-18 NOTE — Assessment & Plan Note (Signed)
-  physical exam completed; reviewed screenings/immunizations with pt -PAP today

## 2021-07-18 NOTE — Telephone Encounter (Signed)
Printed

## 2021-07-18 NOTE — Progress Notes (Signed)
Cholesterol elevated. I'll see her today.

## 2021-07-18 NOTE — Assessment & Plan Note (Signed)
PAP completed today 

## 2021-07-19 DIAGNOSIS — Z0001 Encounter for general adult medical examination with abnormal findings: Secondary | ICD-10-CM | POA: Diagnosis not present

## 2021-07-19 DIAGNOSIS — Z23 Encounter for immunization: Secondary | ICD-10-CM | POA: Diagnosis not present

## 2021-07-19 NOTE — Addendum Note (Signed)
Addended by: Jasper Riling on: 07/19/2021 08:09 AM   Modules accepted: Orders

## 2021-07-20 LAB — CYTOLOGY - PAP: Diagnosis: NEGATIVE

## 2021-07-20 NOTE — Progress Notes (Signed)
PAP was negative! Repeat in 3 years.

## 2021-07-21 ENCOUNTER — Ambulatory Visit
Admission: EM | Admit: 2021-07-21 | Discharge: 2021-07-21 | Disposition: A | Discharge status: Home / Self Care | Payer: BC Managed Care – PPO

## 2021-07-21 ENCOUNTER — Other Ambulatory Visit: Payer: Self-pay

## 2021-07-21 DIAGNOSIS — H6123 Impacted cerumen, bilateral: Secondary | ICD-10-CM | POA: Diagnosis not present

## 2021-07-21 NOTE — ED Provider Notes (Signed)
[facility]    CSN: [id number] Arrival date & time: 07/21/21  1158      History   Chief Complaint Chief Complaint  Patient presents with   Otalgia    Serrano:eft ear pain and feels stopped up    HPI Chelsea Serrano Chelsea Serrano is a 25 y.o. female.   Presenting today with 2-day history of ear fullness, muffled hearing and feeling like her ear is stopped up worse on the left.  She states her PCP gave her some Debrox drops which she has been using but is not helping.  Denies drainage, ear pain, headache, recent illness.  Does have a history of cerumen impaction.   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   CLOS FRACTURE MID/PROXIMAL PHALANX/PHALANG HAND 06/06/2010   Qualifier: Diagnosis of  By: Chelsea Apple MD, Stanley     Flat foot 03/09/2021   Migraine     Patient Active Problem List   Diagnosis Date Noted   Cervical cancer screening 07/18/2021   Cerumen impaction 07/18/2021   Hyperbilirubinemia 07/18/2021   HLD (hyperlipidemia) 07/18/2021   Encounter for general adult medical examination with abnormal findings 04/20/2021   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      Home Medications    Prior to Admission medications   Medication Sig Start Date End Date Taking? Authorizing Provider  carbamide peroxide (DEBROX) 6.5 % OTIC solution Place 5 drops into both ears 2 (two) times daily. 07/18/21   Heather Roberts, NP  SPRINTEC 28 0.25-35 MG-MCG tablet Take 1 tablet by mouth daily. 10/30/20   [provider]  valACYclovir (VALTREX) 500 MG tablet Take 1 tablet (500 mg total) by mouth every 12 (twelve) hours. Take for recurrence of symptoms. Take as soon as possible within 24 hours of symptom onset. 04/20/21   Heather Roberts, NP  Zinc Oxide 40 % PSTE Apply to affected area  up to TID AS NEEDED 04/20/21   Heather Roberts, NP    Family History Family History  Problem Relation Age of Onset   Cancer Maternal Grandmother    Cancer Paternal Grandmother     Social History Social History   Tobacco Use   Smoking status: Never   Smokeless tobacco: Never  Vaping Use   Vaping Use: Never used  Substance Use Topics   Alcohol use: No   Drug use: No     Allergies   Patient has no known allergies.   Review of Systems Review of Systems Per HPI  Physical Exam Triage Vital Signs ED Triage Vitals  Enc Vitals Group     BP 07/21/21 1445 113/74     Pulse Rate 07/21/21 1445 71     Resp 07/21/21 1445 16     Temp 07/21/21 1445 98.9 F (37.2 C)     Temp Source 07/21/21 1445 Oral     SpO2 07/21/21 1445 98 %     Weight --      Height --      Head Circumference --      Peak Flow --      Pain Score 07/21/21 1441 0     Pain Loc --      Pain Edu? --      Excl. in GC? --  No data found.  Updated Vital Signs BP 113/74 (BP Location: Right Arm)    Pulse 71    Temp 98.9 F (37.2 C) (Oral)    Resp 16    LMP 07/18/2021    SpO2 98%   Visual Acuity Right Eye Distance:   Left Eye Distance:   Bilateral Distance:    Right Eye Near:   Left Eye Near:    Bilateral Near:     Physical Exam Vitals and nursing note reviewed.  Constitutional:      Appearance: Normal appearance. She is not ill-appearing.  HENT:     Head: Atraumatic.     Right Ear: There is impacted cerumen.     Left Ear: There is impacted cerumen.  Eyes:     Extraocular Movements: Extraocular movements intact.     Conjunctiva/sclera: Conjunctivae normal.  Cardiovascular:     Rate and Rhythm: Normal rate and regular rhythm.     Heart sounds: Normal heart sounds.  Pulmonary:     Effort: Pulmonary effort is normal.     Breath sounds: Normal breath sounds.  Musculoskeletal:        General: Normal range of motion.     Cervical back: Normal range of motion and neck supple.  Skin:    General:  Skin is warm and dry.  Neurological:     Mental Status: She is alert and oriented to person, place, and time.  Psychiatric:        Mood and Affect: Mood normal.        Thought Content: Thought content normal.        Judgment: Judgment normal.     UC Treatments / Results  Labs (all labs ordered are listed, but only abnormal results are displayed) Labs Reviewed - No data to display  EKG   Radiology No results found.  Procedures Procedures (including critical care time)  Medications Ordered in UC Medications - No data to display  Initial Impression / Assessment and Plan / UC Course  I have reviewed the triage vital signs and the nursing notes.  Pertinent labs & imaging results that were available during my care of the patient were reviewed by me and considered in my medical decision making (see chart for details).     Lavage performed with warm water and peroxide today bilaterally with full clearance of cerumen impactions.  TMs visualized and benign post lavage bilaterally.  She states symptomatic improvement from this.  Has Debrox drops at home that she can use as needed for maintenance.  Return for worsening symptoms  Final Clinical Impressions(s) / UC Diagnoses   Final diagnoses:  Bilateral impacted cerumen   Discharge Instructions   None    ED Prescriptions   None    PDMP not reviewed this encounter.   Particia Nearing, [place] 07/21/21 [phone]

## 2021-07-21 NOTE — ED Triage Notes (Signed)
Patient states her left ear is clogged for 2 days.   Patient states that her PCP gave her some ear drops and it helped the right ear but the left ear is still stopped up.  Denies fever

## 2021-07-25 ENCOUNTER — Encounter: Payer: Self-pay | Admitting: Nurse Practitioner

## 2021-07-25 ENCOUNTER — Other Ambulatory Visit: Payer: Self-pay

## 2021-07-25 ENCOUNTER — Ambulatory Visit: Payer: BC Managed Care – PPO | Admitting: Nurse Practitioner

## 2021-07-25 VITALS — BP 118/78 | HR 71 | Ht 65.0 in | Wt 151.0 lb

## 2021-07-25 DIAGNOSIS — E785 Hyperlipidemia, unspecified: Secondary | ICD-10-CM | POA: Diagnosis not present

## 2021-07-25 DIAGNOSIS — H6123 Impacted cerumen, bilateral: Secondary | ICD-10-CM | POA: Diagnosis not present

## 2021-07-25 NOTE — Assessment & Plan Note (Signed)
Will recheck lipid panel in 5 months month. Eat a healthy diet, including lots of fruits and vegetables. Avoid foods with a lot of saturated and trans fats, such as red meat, butter, fried foods and cheese . Maintain a healthy weight.

## 2021-07-25 NOTE — Assessment & Plan Note (Signed)
Had a visit to urgent care on 12/31, ears were flushed then, ears appears normal today on examination.Marland Kitchen

## 2021-07-25 NOTE — Patient Instructions (Signed)
Please get your lab done 3-5 days before your next visit   Eat a healthy diet, including lots of fruits and vegetables. Avoid foods with a lot of saturated and trans fats, such as red meat, butter, fried foods and cheese . Maintain a healthy weight.

## 2021-07-25 NOTE — Progress Notes (Signed)
° °  Chelsea Serrano     MRN: [id number]      DOB: [birth year]   HPI [name] is here for follow up.  Pt is here to get her ears rechecked , denies trouble hearing, drainage, pain. She had her ears flushed in urgent care on 12/31, she had used debrox at home but it clogged her ears     ROS Denies recent fever or chills. Denies sinus pressure, nasal congestion, ear pain or sore throat. Denies chest congestion, productive cough or wheezing. Denies chest pains, palpitations and leg swelling Denies abdominal pain, nausea, vomiting,diarrhea or constipation.   Denies dysuria, frequency, hesitancy or incontinence. Denies joint pain, swelling and limitation in mobility. Denies headaches, seizures, numbness, or tingling. Denies depression, anxiety or insomnia. Denies skin break down or rash.   PE  BP 118/78 (BP Location: Right Arm, Patient Position: Sitting, Cuff Size: Normal)    Pulse 71    Ht 5\' 5"  (1.651 m)    Wt 151 lb (68.5 kg)    LMP 07/18/2021    SpO2 98%    BMI 25.13 kg/m   Patient alert and oriented and in no cardiopulmonary distress.  HEENT: No facial asymmetry, EOMI,     Neck supple . Bilateral ears appear normal, normal TM   Chest: Clear to auscultation bilaterally.  CVS: S1, S2 no murmurs, no S3.Regular rate.  ABD: Soft non tender.   Ext: No edema  MS: Adequate ROM spine, shoulders, hips and knees.  Skin: Intact, no ulcerations or rash noted.  Psych: Good eye contact, normal affect. Memory intact not anxious or depressed appearing.  CNS: CN 2-12 intact, power,  normal throughout.no focal deficits noted.   Assessment & Plan

## 2021-08-20 ENCOUNTER — Ambulatory Visit: Payer: BC Managed Care – PPO | Admitting: Nurse Practitioner

## 2021-11-26 ENCOUNTER — Ambulatory Visit
Admission: RE | Admit: 2021-11-26 | Discharge: 2021-11-26 | Disposition: A | Discharge status: Home / Self Care | Payer: Self-pay | Source: Ambulatory Visit | Attending: Nurse Practitioner | Admitting: Nurse Practitioner

## 2021-11-26 VITALS — BP 117/79 | HR 90 | Temp 98.6°F | Resp 16

## 2021-11-26 DIAGNOSIS — K6289 Other specified diseases of anus and rectum: Secondary | ICD-10-CM

## 2021-11-26 NOTE — ED Triage Notes (Signed)
Pt reports she has a rash between buttocks after she used Desitin. States the area is black and when scrapped off turned to red.  ?

## 2021-11-26 NOTE — Discharge Instructions (Signed)
As discussed, your exam shows that the area consist of scabbing and scaling of healing skin. ?Do not rub, pill, or disrupt the area. ?Recommend keeping the area moisturized with Vaseline petroleum jelly.  Apply as needed to keep the area moist and to promote healing. ?Clean the area with warm water. ?Due to the large surface area, it may take 1 to 2 weeks to see some resolution of your symptoms. ?Follow-up if symptoms do not improve. ?

## 2021-11-26 NOTE — ED Provider Notes (Signed)
?[facility] ? ? ? ?CSN: [id number] ?Arrival date & time: 11/26/21  1259 ? ? ?  ? ?History   ?Chief Complaint ?Chief Complaint  ?Patient presents with  ? Rash  ?  I used destin zinc oxide maximum strength in between my buttocks for a rash but it dried, turned black and is now stuck on my skin.I tried to scrape it off in one area but my skin turned red. - Entered by patient  ? ? ?HPI ?Chelsea Serrano is a 26 y.o. female.  ? ?The patient is a 26 year old female who presents with darkening area of rash between her buttocks.  She states that earlier last week she used Desitin.  Then she noticed that the area became black.  She states she tried to scrape it off but the area became red.  She uses Desitin regularly because she has shaving to the area.  She has not tried any other creams or moisturizers to the area.  She states that the area that she did scratch off is painful. ? ?The history is provided by the patient.  ?Rash ?Location: buttocks. ?Quality: scaling   ?Severity:  Mild ?Context: exposure to similar rash and medications (desitin)   ?Relieved by:  Nothing ?Associated symptoms: no fatigue, no fever and no periorbital edema   ? ?Past Medical History:  ?Diagnosis Date  ? Auditory processing disorder   ? central auditory processing disorder  ? CLOS FRACTURE MID/PROXIMAL PHALANX/PHALANG HAND 06/06/2010  ? Qualifier: Diagnosis of  By: Romeo Apple MD, Duffy Rhody    ? Flat foot 03/09/2021  ? Migraine   ? ? ?Patient Active Problem List  ? Diagnosis Date Noted  ? Cervical cancer screening 07/18/2021  ? Cerumen impaction 07/18/2021  ? Hyperbilirubinemia 07/18/2021  ? HLD (hyperlipidemia) 07/18/2021  ? Encounter for general adult medical examination with abnormal findings 04/20/2021  ? 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 ? ?Past Surgical History:  ?Procedure Laterality Date  ? NO PAST SURGERIES    ? ? ?OB History   ?No obstetric history on file. ?  ? ? ? ?Home Medications   ? ?Prior to Admission  medications   ?Medication Sig Start Date End Date Taking? Authorizing Provider  ?SPRINTEC 28 0.25-35 MG-MCG tablet Take 1 tablet by mouth daily. 10/30/20   [provider]  ?valACYclovir (VALTREX) 500 MG tablet Take 1 tablet (500 mg total) by mouth every 12 (twelve) hours. Take for recurrence of symptoms. Take as soon as possible within 24 hours of symptom onset. 04/20/21   Heather Roberts, NP  ?Zinc Oxide 40 % PSTE Apply to affected area up to TID AS NEEDED 04/20/21   Heather Roberts, NP  ? ? ?Family History ?Family History  ?Problem Relation Age of Onset  ? Cancer Maternal Grandmother   ? Cancer Paternal Grandmother   ? ? ?Social History ?Social History  ? ?Tobacco Use  ? Smoking status: Never  ? Smokeless tobacco: Never  ?Vaping Use  ? Vaping Use: Never used  ?Substance Use Topics  ? Alcohol use: No  ? Drug use: No  ? ? ? ?Allergies   ?Patient has no known allergies. ? ? ?Review of Systems ?Review of Systems  ?Constitutional: Negative.  Negative for fatigue and fever.  ?Gastrointestinal: Negative.   ?Skin:  Positive for wound.  ?Psychiatric/Behavioral: Negative.    ? ? ?Physical Exam ?Triage Vital Signs ?ED Triage Vitals  ?Enc Vitals Group  ?   BP 11/26/21 1338 117/79  ?  Pulse Rate 11/26/21 1338 90  ?   Resp 11/26/21 1338 16  ?   Temp 11/26/21 1338 98.6 ?F (37 ?C)  ?   Temp Source 11/26/21 1338 Oral  ?   SpO2 11/26/21 1338 98 %  ?   Weight --   ?   Height --   ?   Head Circumference --   ?   Peak Flow --   ?   Pain Score 11/26/21 1337 0  ?   Pain Loc --   ?   Pain Edu? --   ?   Excl. in GC? --   ? ?No data found. ? ?Updated Vital Signs ?BP 117/79 (BP Location: Right Arm)   Pulse 90   Temp 98.6 ?F (37 ?C) (Oral)   Resp 16   SpO2 98%  ? ?Visual Acuity ?Right Eye Distance:   ?Left Eye Distance:   ?Bilateral Distance:   ? ?Right Eye Near:   ?Left Eye Near:    ?Bilateral Near:    ? ?Physical Exam ?Vitals and nursing note reviewed.  ?Constitutional:   ?   Appearance: Normal appearance.  ?Musculoskeletal:  ?    Cervical back: Normal range of motion.  ?Skin: ?   General: Skin is warm and dry.  ?   Comments: Escharotic tissue noted to the bilateral buttocks. Area is dry and has a dark appearance that extends to peri-rectal region. Area is flaking and scaling, with dark flakes present. No drainage present. Attempted to remove scaling with normal saline and gauze, small amount of dark flakes and crusting removed.   ?Neurological:  ?   General: No focal deficit present.  ?   Mental Status: She is alert and oriented to person, place, and time.  ? ? ? ?UC Treatments / Results  ?Labs ?(all labs ordered are listed, but only abnormal results are displayed) ?Labs Reviewed - No data to display ? ?EKG ? ? ?Radiology ?No results found. ? ?Procedures ?Procedures (including critical care time) ? ?Medications Ordered in UC ?Medications - No data to display ? ?Initial Impression / Assessment and Plan / UC Course  ?I have reviewed the triage vital signs and the nursing notes. ? ?Pertinent labs & imaging results that were available during my care of the patient were reviewed by me and considered in my medical decision making (see chart for details). ? ?The patient is a 26 year old female who presents with skin irritation to her rectum.  Symptoms have been present over the past week after she use Desitin to the area.  Today she presents with scabbing and eschar Rodick healing tissue to her bilateral buttocks.  Attempted to wipe the area where she thought it was Desitin, area is consistent with scabbing.  Was able to remove dark flakes from her rectum with gauze and normal saline.  Symptoms are consistent with rectal irritation.  There are no signs of infection at this time.  Discussed with patient the importance of keeping the area moisturized.  Advised her that Vaseline should help with her symptoms.  Patient advised to clean the area with warm water.  Would recommend that she hold on the Desitin until her symptoms improve.  Patient advised  to follow-up as needed. ?Final Clinical Impressions(s) / UC Diagnoses  ? ?Final diagnoses:  ?Rectal irritation  ? ? ? ?Discharge Instructions   ? ?  ?As discussed, your exam shows that the area consist of scabbing and scaling of healing skin. ?Do not rub, pill, or disrupt the  area. ?Recommend keeping the area moisturized with Vaseline petroleum jelly.  Apply as needed to keep the area moist and to promote healing. ?Clean the area with warm water. ?Due to the large surface area, it may take 1 to 2 weeks to see some resolution of your symptoms. ?Follow-up if symptoms do not improve. ? ? ? ? ?ED Prescriptions   ?None ?  ? ?PDMP not reviewed this encounter. ?  ?Abran Cantor, NP ?11/26/21 1405 ? ?

## 2021-12-22 LAB — CMP14+EGFR
ALT: 15 IU/L (ref 0–32)
AST: 19 IU/L (ref 0–40)
Albumin/Globulin Ratio: 1.2 (ref 1.2–2.2)
Albumin: 4.1 g/dL (ref 3.9–5.0)
Alkaline Phosphatase: 41 IU/L — ABNORMAL LOW (ref 44–121)
BUN/Creatinine Ratio: 17 (ref 9–23)
BUN: 12 mg/dL (ref 6–20)
Bilirubin Total: 0.6 mg/dL (ref 0.0–1.2)
CO2: 21 mmol/L (ref 20–29)
Calcium: 9.1 mg/dL (ref 8.7–10.2)
Chloride: 102 mmol/L (ref 96–106)
Creatinine, Ser: 0.71 mg/dL (ref 0.57–1.00)
Globulin, Total: 3.5 g/dL (ref 1.5–4.5)
Glucose: 78 mg/dL (ref 70–99)
Potassium: 3.8 mmol/L (ref 3.5–5.2)
Sodium: 137 mmol/L (ref 134–144)
Total Protein: 7.6 g/dL (ref 6.0–8.5)
eGFR: 122 mL/min/{1.73_m2} (ref >59)

## 2021-12-24 ENCOUNTER — Ambulatory Visit: Payer: BC Managed Care – PPO | Admitting: Nurse Practitioner

## 2021-12-25 ENCOUNTER — Ambulatory Visit (INDEPENDENT_AMBULATORY_CARE_PROVIDER_SITE_OTHER): Payer: Self-pay | Admitting: Nurse Practitioner

## 2021-12-25 ENCOUNTER — Encounter: Payer: Self-pay | Admitting: Nurse Practitioner

## 2021-12-25 VITALS — BP 130/83 | HR 73 | Ht 65.0 in | Wt 154.0 lb

## 2021-12-25 DIAGNOSIS — E663 Overweight: Secondary | ICD-10-CM

## 2021-12-25 DIAGNOSIS — B009 Herpesviral infection, unspecified: Secondary | ICD-10-CM

## 2021-12-25 DIAGNOSIS — E785 Hyperlipidemia, unspecified: Secondary | ICD-10-CM

## 2021-12-25 NOTE — Assessment & Plan Note (Signed)
Wt Readings from Last 3 Encounters:  12/25/21 154 lb (69.9 kg)  07/25/21 151 lb (68.5 kg)  07/18/21 152 lb 1.9 oz (69 kg)  Patient counseled on low-carb diet, encouraged to exercise daily at least 150 minutes weekly.

## 2021-12-25 NOTE — Assessment & Plan Note (Signed)
Denies recent outbreaks of HSV-2

## 2021-12-25 NOTE — Progress Notes (Signed)
   Chelsea Serrano     MRN: [id number]      DOB: [birth year]   HPI Chelsea Serrano with past medical history of hyperlipidemia, HSV-2 is here for follow up and re-evaluation of her chronic medical conditions.   HSV-2.  Patient denies recent outbreaks of herpes infection, has Valtrex 500mg  BID ordered as needed for outbreaks   Patient denies any complaints today    ROS Denies recent fever or chills. Denies sinus pressure, nasal congestion, ear pain or sore throat. Denies chest congestion, productive cough or wheezing. Denies chest pains, palpitations and leg swelling Denies abdominal pain, nausea, vomiting,diarrhea or constipation.   Denies dysuria, frequency, hesitancy or incontinence. Denies joint pain, swelling and limitation in mobility. Denies headaches, seizures, numbness, or tingling. Denies depression, anxiety or insomnia.    PE  BP 130/83 (BP Location: Right Arm, Patient Position: Sitting, Cuff Size: Normal)   Pulse 73   Ht 5\' 5"  (1.651 m)   Wt 154 lb (69.9 kg)   LMP 12/04/2021 (Approximate)   SpO2 99%   BMI 25.63 kg/m   Patient alert and oriented and in no cardiopulmonary distress.  Chest: Clear to auscultation bilaterally.  CVS: S1, S2 no murmurs, no S3.Regular rate.  ABD: Soft non tender.   Ext: No edema  MS: Adequate ROM spine, shoulders, hips and knees.  Psych: Good eye contact, normal affect. Memory intact not anxious or depressed appearing.  CNS: CN 2-12 intact, power,  normal throughout.no focal deficits noted.   Assessment & Plan  HLD (hyperlipidemia) Lab Results  Component Value Date   CHOL 209 (H) 07/17/2021   HDL 53 07/17/2021   LDLCALC 145 (H) 07/17/2021   TRIG 62 07/17/2021  Patient encouraged to avoid fried fatty foods Engage in regular daily exercises at least 150 minutes weekly We will check lipid panel at her next visit since, has no history of diabetes  HSV-2 (herpes simplex virus 2) infection Denies recent outbreaks of  HSV-2  Overweight (BMI 25.0-29.9) Wt Readings from Last 3 Encounters:  12/25/21 154 lb (69.9 kg)  07/25/21 151 lb (68.5 kg)  07/18/21 152 lb 1.9 oz (69 kg)  Patient counseled on low-carb diet, encouraged to exercise daily at least 150 minutes weekly.

## 2021-12-25 NOTE — Assessment & Plan Note (Signed)
Lab Results  Component Value Date   CHOL 209 (H) 07/17/2021   HDL 53 07/17/2021   LDLCALC 145 (H) 07/17/2021   TRIG 62 07/17/2021  Patient encouraged to avoid fried fatty foods Engage in regular daily exercises at least 150 minutes weekly We will check lipid panel at her next visit since, has no history of diabetes

## 2021-12-25 NOTE — Patient Instructions (Signed)

## 2022-04-08 ENCOUNTER — Ambulatory Visit
Admission: RE | Admit: 2022-04-08 | Discharge: 2022-04-08 | Disposition: A | Discharge status: Home / Self Care | Payer: Medicaid Other | Source: Ambulatory Visit | Attending: Nurse Practitioner | Admitting: Nurse Practitioner

## 2022-04-08 ENCOUNTER — Ambulatory Visit (INDEPENDENT_AMBULATORY_CARE_PROVIDER_SITE_OTHER): Payer: Self-pay

## 2022-04-08 VITALS — BP 114/74 | HR 86 | Temp 98.7°F | Resp 17

## 2022-04-08 DIAGNOSIS — R0781 Pleurodynia: Secondary | ICD-10-CM

## 2022-04-08 DIAGNOSIS — R059 Cough, unspecified: Secondary | ICD-10-CM

## 2022-04-08 DIAGNOSIS — R051 Acute cough: Secondary | ICD-10-CM

## 2022-04-08 MED ORDER — BENZONATATE 100 MG PO CAPS: 100.0000 mg | ORAL_CAPSULE | Freq: Three times a day (TID) | ORAL | 0 refills | Status: DC | PRN

## 2022-04-08 MED ORDER — AZITHROMYCIN 250 MG PO TABS: ORAL_TABLET | ORAL | 0 refills | Status: DC

## 2022-04-08 MED ORDER — PROMETHAZINE-DM 6.25-15 MG/5ML PO SYRP: 5.0000 mL | ORAL_SOLUTION | Freq: Every evening | ORAL | 0 refills | Status: DC | PRN

## 2022-04-08 NOTE — Discharge Instructions (Addendum)
Chest xray shows atypical pneumonia; please take the azithromycin as prescribed.  You can use the cough perles while you are awake and cough syrup before bed to help suppress cough.  You can also use Tylenol/ibuprofen as needed for the pain.  Follow up with your primary care provider in about 6 weeks to recheck a chest x-ray.  Follow up with no improvement in symptoms.

## 2022-04-08 NOTE — ED Triage Notes (Signed)
Pt presents with complaints of cough x 2 weeks. She started out with cold symptoms that has subsided but cough continues. Pt now has pain in her right rib.

## 2022-04-08 NOTE — ED Provider Notes (Signed)
[facility]    CSN: [id number] Arrival date & time: 04/08/22  0849      History   Chief Complaint Chief Complaint  Patient presents with   Cough    Had a cold about two weeks ago. I had a headache, stuffy nose and was sneezing. About a week later, I developed a cough. I'm still coughing and now my right side hurts. - Entered by patient    HPI Chelsea Serrano is a 26 y.o. female.   Patient presents with cough that has been ongoing for the past couple of weeks.  Reports initially when she developed symptoms, she had a lot of congestion, sore throat, and overall was feeling pretty bad.  Reports all of her symptoms have improved beside the dry cough.  She denies chest pain, wheezing, chest tightness, or chest congestion.  Reports yesterday she did develop pain in the right side of her rib cage when she coughs.  She does not have any inspiratory pain.  No abdominal pain, nausea/vomiting, or diarrhea.  No decreased appetite or fatigue.  She did not test for COVID-19 while she was sick.  She has been taking NyQuil for her symptoms with minimal relief.  She is, she is not pregnant;  LMP 03/18/22 and she is on Sprintec oral contraceptive.    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      Home Medications    Prior to Admission medications   Medication Sig Start Date End Date Taking? Authorizing Provider  azithromycin (ZITHROMAX) 250 MG tablet Take (2) tablets by mouth on day 1, then take (1) tablet by mouth on days 2-5. 04/08/22  Yes Eulogio Bear, NP  benzonatate (TESSALON) 100 MG capsule Take 1 capsule (100 mg total) by mouth 3 (three) times daily as needed for cough. Do not take with alcohol or while driving or operating heavy machinery 04/08/22  Yes Eulogio Bear, NP  promethazine-dextromethorphan (PROMETHAZINE-DM) 6.25-15 MG/5ML syrup Take 5 mLs by mouth at bedtime as needed for cough. Do not take with alcohol or while driving or operating heavy machinery 04/08/22  Yes Eulogio Bear, NP  SPRINTEC 28 0.25-35 MG-MCG tablet Take 1 tablet by mouth daily. 10/30/20   [provider]  valACYclovir (VALTREX) 500 MG tablet Take 1 tablet (500 mg total) by mouth every 12 (twelve) hours. Take for recurrence of symptoms. Take as soon as possible within 24 hours of symptom onset. 04/20/21   Noreene Larsson, NP  Zinc Oxide 40 % PSTE Apply to affected area up to TID AS NEEDED 04/20/21   Noreene Larsson, NP    Family History Family History  Problem Relation Age of Onset   Healthy Mother    Healthy Father  Cancer Maternal Grandmother    Cancer Paternal Grandmother     Social History Social History   Tobacco Use   Smoking status: Never   Smokeless tobacco: Never  Vaping Use   Vaping Use: Never used  Substance Use Topics   Alcohol use: No   Drug use: No     Allergies   Patient has no known allergies.   Review of Systems Review of Systems Per HPI  Physical Exam Triage Vital Signs ED Triage Vitals  Enc Vitals Group     BP 04/08/22 0916 114/74     Pulse Rate 04/08/22 0916 86     Resp 04/08/22 0916 17     Temp 04/08/22 0916 98.7 F (37.1 C)     Temp src --      SpO2 04/08/22 0916 99 %     Weight --      Height --       Head Circumference --      Peak Flow --      Pain Score 04/08/22 0914 7     Pain Loc --      Pain Edu? --      Excl. in GC? --    No data found.  Updated Vital Signs BP 114/74   Pulse 86   Temp 98.7 F (37.1 C)   Resp 17   LMP 03/18/2022   SpO2 99%   Visual Acuity Right Eye Distance:   Left Eye Distance:   Bilateral Distance:    Right Eye Near:   Left Eye Near:    Bilateral Near:     Physical Exam Vitals and nursing note reviewed.  Constitutional:      General: She is not in acute distress.    Appearance: Normal appearance. She is not ill-appearing or toxic-appearing.  HENT:     Head: Normocephalic and atraumatic.     Right Ear: Tympanic membrane, ear canal and external ear normal.     Left Ear: Tympanic membrane, ear canal and external ear normal.     Nose: Nose normal. No congestion or rhinorrhea.     Mouth/Throat:     Mouth: Mucous membranes are moist.     Pharynx: Oropharynx is clear. No oropharyngeal exudate or posterior oropharyngeal erythema.     Tonsils: No tonsillar exudate. 1+ on the right. 1+ on the left.  Eyes:     General: No scleral icterus.    Extraocular Movements: Extraocular movements intact.  Cardiovascular:     Rate and Rhythm: Normal rate and regular rhythm.     Heart sounds: Murmur heard.  Pulmonary:     Effort: Pulmonary effort is normal. No respiratory distress.     Breath sounds: Normal breath sounds. Decreased air movement present. No wheezing, rhonchi or rales.  Abdominal:     General: Abdomen is flat. Bowel sounds are normal. There is no distension.     Palpations: Abdomen is soft.  Musculoskeletal:     Cervical back: Normal range of motion and neck supple.  Lymphadenopathy:     Cervical: No cervical adenopathy.  Skin:    General: Skin is warm and dry.     Coloration: Skin is not jaundiced or pale.     Findings: No erythema or rash.  Neurological:     Mental Status: She is alert and oriented to person, place, and time.   Psychiatric:        Behavior: Behavior is cooperative.      UC Treatments / Results  Labs (all labs ordered are listed, but only abnormal results are displayed) Labs Reviewed - No data to display  EKG   Radiology DG Chest 2 View  Result Date: 04/08/2022 CLINICAL DATA:  Cough for 2 weeks. EXAM: CHEST - 2 VIEW COMPARISON:  None Available. FINDINGS: Mild bilateral interstitial thickening as can be seen with interstitial infection including atypical etiologies. No focal consolidation. No pleural effusion or pneumothorax. Heart and mediastinal contours are unremarkable. No acute osseous abnormality. IMPRESSION: 1. Mild bilateral interstitial thickening as can be seen with interstitial infection including atypical etiologies. Electronically Signed   By: Elige Ko M.D.   On: 04/08/2022 09:45    Procedures Procedures (including critical care time)  Medications Ordered in UC Medications - No data to display  Initial Impression / Assessment and Plan / UC Course  I have reviewed the triage vital signs and the nursing notes.  Pertinent labs & imaging results that were available during my care of the patient were reviewed by me and considered in my medical decision making (see chart for details).    Patient is well-appearing, normotensive, afebrile, not tachycardic, not tachypneic, oxygenating well on room air.  Chest x-ray today shows mild bilateral interstitial thickening as seen with atypical etiologies.  Will cover patient for atypical pneumonia with azithromycin.  Supportive care discussed.  ER precautions and return precautions discussed.  Recommended follow-up with primary care provider within the 6-week timeframe for repeat chest x-ray.  Start cough suppressants as needed for dry cough.  Note given for work.  The patient was given the opportunity to ask questions.  All questions answered to their satisfaction.  The patient is in agreement to this plan.   Final Clinical Impressions(s)  / UC Diagnoses   Final diagnoses:  Acute cough  Rib pain on right side     Discharge Instructions      Chest xray shows atypical pneumonia; please take the azithromycin as prescribed.  You can use the cough perles while you are awake and cough syrup before bed to help suppress cough.  You can also use Tylenol/ibuprofen as needed for the pain.  Follow up with your primary care provider in about 6 weeks to recheck a chest x-ray.  Follow up with no improvement in symptoms.     ED Prescriptions     Medication Sig Dispense Auth. Provider   promethazine-dextromethorphan (PROMETHAZINE-DM) 6.25-15 MG/5ML syrup Take 5 mLs by mouth at bedtime as needed for cough. Do not take with alcohol or while driving or operating heavy machinery 118 mL Cathlean Marseilles A, NP   benzonatate (TESSALON) 100 MG capsule Take 1 capsule (100 mg total) by mouth 3 (three) times daily as needed for cough. Do not take with alcohol or while driving or operating heavy machinery 21 capsule Cathlean Marseilles A, NP   azithromycin (ZITHROMAX) 250 MG tablet Take (2) tablets by mouth on day 1, then take (1) tablet by mouth on days 2-5. 6 tablet Valentino Nose, NP      PDMP not reviewed this encounter.   Valentino Nose, NP 04/08/22 1055

## 2022-06-25 ENCOUNTER — Encounter: Payer: Self-pay | Admitting: Family Medicine

## 2022-06-25 ENCOUNTER — Ambulatory Visit (INDEPENDENT_AMBULATORY_CARE_PROVIDER_SITE_OTHER): Payer: Medicaid Other | Admitting: Family Medicine

## 2022-06-25 VITALS — BP 122/80 | HR 73 | Ht 64.0 in | Wt 160.0 lb

## 2022-06-25 DIAGNOSIS — Z23 Encounter for immunization: Secondary | ICD-10-CM | POA: Diagnosis not present

## 2022-06-25 DIAGNOSIS — E038 Other specified hypothyroidism: Secondary | ICD-10-CM | POA: Diagnosis not present

## 2022-06-25 DIAGNOSIS — Z114 Encounter for screening for human immunodeficiency virus [HIV]: Secondary | ICD-10-CM

## 2022-06-25 DIAGNOSIS — R7301 Impaired fasting glucose: Secondary | ICD-10-CM

## 2022-06-25 DIAGNOSIS — E7849 Other hyperlipidemia: Secondary | ICD-10-CM | POA: Diagnosis not present

## 2022-06-25 DIAGNOSIS — E559 Vitamin D deficiency, unspecified: Secondary | ICD-10-CM | POA: Diagnosis not present

## 2022-06-25 DIAGNOSIS — Z1159 Encounter for screening for other viral diseases: Secondary | ICD-10-CM | POA: Diagnosis not present

## 2022-06-25 DIAGNOSIS — Z0001 Encounter for general adult medical examination with abnormal findings: Secondary | ICD-10-CM | POA: Diagnosis not present

## 2022-06-25 NOTE — Patient Instructions (Addendum)
I appreciate the opportunity to provide care to you today!    Follow up:  5 months  Labs: please stop by the lab today to get your blood drawn (CBC, CMP, TSH, Lipid profile, HgA1c, Vit D)  Screening: HIV and Hep C  Thank you for getting your flu vaccine   Please continue to a heart-healthy diet and increase your physical activities. Try to exercise for at least three times a week.      It was a pleasure to see you and I look forward to continuing to work together on your health and well-being. Please do not hesitate to call the office if you need care or have questions about your care.   Have a wonderful day and week. With Gratitude, Gilmore Laroche MSN, FNP-BC

## 2022-06-25 NOTE — Assessment & Plan Note (Signed)
Patient educated on CDC recommendation for the vaccine. Verbal consent was obtained from the patient, vaccine administered by nurse, no sign of adverse reactions noted at this time. Patient education on arm soreness and use of tylenol or ibuprofen for this patient  was discussed. Patient educated on the signs and symptoms of adverse effect and advise to contact the office if they occur.  

## 2022-06-25 NOTE — Assessment & Plan Note (Signed)
Physical exam as documented Counseling is done on healthy lifestyle involving commitment to 150 minutes of exercise per week,  Discussed heart-healthy diet  Patient will receive her flu vaccination today

## 2022-06-25 NOTE — Progress Notes (Signed)
Complete physical exam  Patient: Chelsea Serrano   DOB: May 05, 1996   26 y.o. Female  MRN: [id number]  Subjective:    Chief Complaint  Patient presents with   Annual Exam    Cpe today, has a form for work to have completed.     Chelsea Serrano is a 26 y.o. female who presents today for a complete physical exam. She reports consuming a general diet. The patient does not participate in regular exercise at present. She generally feels well. She reports sleeping well. She does not have additional problems to discuss today.    Most recent fall risk assessment:    06/25/2022    8:15 AM  Fall Risk   Falls in the past year? 0  Number falls in past yr: 0  Injury with Fall? 0  Risk for fall due to : No Fall Risks  Follow up Falls evaluation completed     Most recent depression screenings:    06/25/2022    8:15 AM 12/25/2021    9:29 AM  PHQ 2/9 Scores  PHQ - 2 Score 0 0  PHQ- 9 Score 1     Dental: No current dental problems and Last dental visit: 04/2022  Patient Active Problem List   Diagnosis Date Noted   Need for immunization against influenza 06/25/2022   Overweight (BMI 25.0-29.9) 12/25/2021   Cervical cancer screening 07/18/2021   Cerumen impaction 07/18/2021   Hyperbilirubinemia 07/18/2021   HLD (hyperlipidemia) 07/18/2021   Encounter for general adult medical examination with abnormal findings 04/20/2021   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   CLOS FRACTURE MID/PROXIMAL PHALANX/PHALANG HAND 06/06/2010   Qualifier: Diagnosis of  By: Aline Brochure MD, Stanley     Flat foot 03/09/2021   Migraine    Past Surgical History:  Procedure Laterality Date   NO PAST SURGERIES     Social History   Tobacco Use   Smoking status: Never   Smokeless tobacco: Never  Vaping Use   Vaping Use: Never used  Substance Use Topics   Alcohol use: No   Drug  use: No   Social History   Socioeconomic History   Marital status: Single    Spouse name: Not on file   Number of children: 0   Years of education: Not on file   Highest education level: Not on file  Occupational History   Occupation: Therapist, 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  Tobacco Use   Smoking status: Never   Smokeless tobacco: Never  Vaping Use   Vaping Use: Never used  Substance and Sexual Activity   Alcohol use: No   Drug use: No   Sexual activity: Yes    Birth control/protection: Pill  Other Topics Concern   Not on file  Social History Narrative   Not on file   Social Determinants of Health   Financial Resource Strain: Not on file  Food Insecurity: Not on file  Transportation Needs: Not on file  Physical Activity: Not on file  Stress: Not on file  Social Connections: Not on file  Intimate Partner Violence: Not on file   Family Status  Relation Name Status   Mother  Alive   Father  Alive   MGM  (Not Specified)       throat cancer   PGM  (Not Specified)       throat cancer  Annamarie Major  (Not Specified)       lung cancer   Family History  Problem Relation Age of Onset   Healthy Mother    Healthy Father    Cancer Maternal Grandmother    Cancer Paternal Grandmother    No Known Allergies    Patient Care Team: Alvira Monday, FNP as PCP - General (Family Medicine)   Outpatient Medications Prior to Visit  Medication Sig   benzonatate (TESSALON) 100 MG capsule Take 1 capsule (100 mg total) by mouth 3 (three) times daily as needed for cough. Do not take with alcohol or while driving or operating heavy machinery   promethazine-dextromethorphan (PROMETHAZINE-DM) 6.25-15 MG/5ML syrup Take 5 mLs by mouth at bedtime as needed for cough. Do not take with alcohol or while driving or operating heavy machinery   SPRINT[place] 28 0.25-35 MG-MCG tablet Take 1 tablet by mouth daily.   valACYclovir (VALTREX) 500 MG tablet Take 1 tablet (500 mg total) by mouth every 12  (twelve) hours. Take for recurrence of symptoms. Take as soon as possible within 24 hours of symptom onset.   Zinc Oxide 40 % PSTE Apply to affected area up to TID AS NEEDED   [DISCONTINUED] azithromycin (ZITHROMAX) 250 MG tablet Take (2) tablets by mouth on day 1, then take (1) tablet by mouth on days 2-5.   No facility-administered medications prior to visit.    Review of Systems  Constitutional:  Negative for fever.  HENT:  Negative for congestion, sinus pain and sore throat.   Eyes:  Negative for pain.  Respiratory:  Negative for cough and shortness of breath.   Cardiovascular:  Negative for chest pain and palpitations.  Gastrointestinal:  Negative for abdominal pain and constipation.  Genitourinary:  Negative for frequency and urgency.  Musculoskeletal:  Negative for back pain and joint pain.  Skin:  Negative for itching and rash.  Neurological:  Negative for dizziness and headaches.  Endo/Heme/Allergies:  Negative for environmental allergies.  Psychiatric/Behavioral:  Negative for memory loss. The patient is not nervous/anxious.        Objective:    BP 122/80   Pulse 73   Ht _0  (1.626 m)   Wt 160 lb 0.6 oz (72.6 kg)   LMP 06/11/2022   SpO2 100%   BMI 27.47 kg/m  BP Readings from Last 3 Encounters:  06/25/22 122/80  04/08/22 114/74  12/25/21 130/83   Wt Readings from Last 3 Encounters:  06/25/22 160 lb 0.6 oz (72.6 kg)  12/25/21 154 lb (69.9 kg)  07/25/21 151 lb (68.5 kg)      Physical Exam HENT:     Head: Normocephalic.     Right Ear: External ear normal.     Left Ear: External ear normal.     Nose: No congestion.     Mouth/Throat:     Mouth: Mucous membranes are moist.  Eyes:     Extraocular Movements: Extraocular movements intact.     Pupils: Pupils are equal, round, and reactive to light.  Cardiovascular:     Rate and Rhythm: Normal rate and regular rhythm.     Pulses: Normal pulses.     Heart sounds: Normal heart sounds.  Abdominal:      Palpations: Abdomen is soft.     Tenderness: There is no right CVA tenderness or left CVA tenderness.  Musculoskeletal:     Cervical back: No rigidity.     Right lower leg: No edema.     Left lower leg: No  edema.  Skin:    Findings: No lesion or rash.  Neurological:     Mental Status: She is alert and oriented to person, place, and time.     GCS: GCS eye subscore is 4. GCS verbal subscore is 5. GCS motor subscore is 6.     Cranial Nerves: No cranial nerve deficit.     Sensory: No sensory deficit.     Motor: No weakness.     Coordination: Coordination normal. Finger-Nose-Finger Test normal.     Gait: Gait normal.  Psychiatric:     Comments: Normal affect     No results found for any visits on 06/25/22. Last CBC Lab Results  Component Value Date   WBC 4.5 07/17/2021   HGB 13.4 07/17/2021   HCT 40.4 07/17/2021   MCV 86 07/17/2021   MCH 28.5 07/17/2021   RDW 11.2 (L) 07/17/2021   PLT 154 22/08/5425   Last metabolic panel Lab Results  Component Value Date   GLUCOSE 78 12/21/2021   NA 137 12/21/2021   K 3.8 12/21/2021   CL 102 12/21/2021   CO2 21 12/21/2021   BUN 12 12/21/2021   CREATININE 0.71 12/21/2021   EGFR 122 12/21/2021   CALCIUM 9.1 12/21/2021   PROT 7.6 12/21/2021   ALBUMIN 4.1 12/21/2021   LABGLOB 3.5 12/21/2021   AGRATIO 1.2 12/21/2021   BILITOT 0.6 12/21/2021   ALKPHOS 41 (L) 12/21/2021   AST 19 12/21/2021   ALT 15 12/21/2021   Last lipids Lab Results  Component Value Date   CHOL 209 (H) 07/17/2021   HDL 53 07/17/2021   LDLCALC 145 (H) 07/17/2021   TRIG 62 07/17/2021   Last hemoglobin A1c No results found for: "HGBA1C" Last thyroid functions No results found for: "TSH", "T3TOTAL", "T4TOTAL", "THYROIDAB" Last vitamin D No results found for: "25OHVITD2", "25OHVITD3", "VD25OH" Last vitamin B12 and Folate No results found for: "VITAMINB12", "FOLATE"      Assessment & Plan:    Routine Health Maintenance and Physical Exam  Immunization  History  Administered Date(s) Administered   DTaP 06/06/1997, 08/12/1997, 10/13/1997, 07/26/1998, 11/11/2001   HIB (PRP-OMP) 06/06/1997, 08/12/1997, 10/13/1997, 05/16/1998   HPV 9-valent 11/21/2015   HPV Quadrivalent 07/21/2013, 07/28/2014   Hepatitis A 05/03/2010, 05/09/2011   Hepatitis B 04/22/1997, 05/23/1997, 10/13/1997   IPV 06/06/1997, 08/12/1997, 10/13/1997, 11/11/2001   Influenza,Quad,Nasal, Live 07/21/2013, 07/28/2014   Influenza,inj,Quad PF,6+ Mos 04/20/2021   MMR 05/16/1998, 11/11/2001   Meningococcal Conjugate 02/13/2009, 07/21/2013   PPD Test 11/17/2019   Tdap 02/13/2009, 07/19/2021   Varicella 05/16/1998, 05/09/2011    Health Maintenance  Topic Date Due   INFLUENZA VACCINE  02/19/2022   PAP-Cervical Cytology Screening  07/18/2024   PAP SMEAR-Modifier  07/18/2024   DTaP/Tdap/Td (8 - Td or Tdap) 07/20/2031   HPV VACCINES  Completed   Hepatitis C Screening  Completed   HIV Screening  Completed    Discussed health benefits of physical activity, and encouraged her to engage in regular exercise appropriate for her age and condition.  Encounter for general adult medical examination with abnormal findings Assessment & Plan: Physical exam as documented Counseling is done on healthy lifestyle involving commitment to 150 minutes of exercise per week,  Discussed heart-healthy diet  Patient will receive her flu vaccination today        Need for immunization against influenza Assessment & Plan: Patient educated on CDC recommendation for the vaccine. Verbal consent was obtained from the patient, vaccine administered by nurse, no sign of adverse reactions noted  at this time. Patient education on arm soreness and use of tylenol or ibuprofen for this patient  was discussed. Patient educated on the signs and symptoms of adverse effect and advise to contact the office if they occur.    Flu vaccine need -     Flu Vaccine QUAD 4moIM (Fluarix, Fluzone & Alfiuria Quad  PF)  IFG (impaired fasting glucose) -     Hemoglobin A1c  Vitamin D deficiency -     VITAMIN D 25 Hydroxy (Vit-D Deficiency, Fractures)  Need for hepatitis C screening test -     Hepatitis C antibody  Encounter for screening for HIV -     HIV Antibody (routine testing w rflx)  Other specified hypothyroidism -     TSH + free T4  Other hyperlipidemia -     Lipid panel -     CMP14+EGFR -     CBC with Differential/Platelet    No follow-ups on file.     GAlvira Monday FNP

## 2022-06-26 LAB — CBC WITH DIFFERENTIAL/PLATELET
Basophils Absolute: 0 10*3/uL (ref 0.0–0.2)
Basos: 1 %
EOS (ABSOLUTE): 0 10*3/uL (ref 0.0–0.4)
Eos: 1 %
Hematocrit: 36.9 % (ref 34.0–46.6)
Hemoglobin: 12.1 g/dL (ref 11.1–15.9)
Immature Grans (Abs): 0 10*3/uL (ref 0.0–0.1)
Immature Granulocytes: 0 %
Lymphocytes Absolute: 1.9 10*3/uL (ref 0.7–3.1)
Lymphs: 42 %
MCH: 27.6 pg (ref 26.6–33.0)
MCHC: 32.8 g/dL (ref 31.5–35.7)
MCV: 84 fL (ref 79–97)
Monocytes Absolute: 0.5 10*3/uL (ref 0.1–0.9)
Monocytes: 11 %
Neutrophils Absolute: 2.1 10*3/uL (ref 1.4–7.0)
Neutrophils: 45 %
Platelets: 131 10*3/uL — ABNORMAL LOW (ref 150–450)
RBC: 4.38 x10E6/uL (ref 3.77–5.28)
RDW: 11.9 % (ref 11.7–15.4)
WBC: 4.6 10*3/uL (ref 3.4–10.8)

## 2022-06-26 LAB — CMP14+EGFR
ALT: 13 IU/L (ref 0–32)
AST: 18 IU/L (ref 0–40)
Albumin/Globulin Ratio: 1.4 (ref 1.2–2.2)
Albumin: 4.1 g/dL (ref 4.0–5.0)
Alkaline Phosphatase: 50 IU/L (ref 44–121)
BUN/Creatinine Ratio: 15 (ref 9–23)
BUN: 11 mg/dL (ref 6–20)
Bilirubin Total: 0.4 mg/dL (ref 0.0–1.2)
CO2: 21 mmol/L (ref 20–29)
Calcium: 9 mg/dL (ref 8.7–10.2)
Chloride: 104 mmol/L (ref 96–106)
Creatinine, Ser: 0.75 mg/dL (ref 0.57–1.00)
Globulin, Total: 2.9 g/dL (ref 1.5–4.5)
Glucose: 90 mg/dL (ref 70–99)
Potassium: 3.9 mmol/L (ref 3.5–5.2)
Sodium: 137 mmol/L (ref 134–144)
Total Protein: 7 g/dL (ref 6.0–8.5)
eGFR: 113 mL/min/{1.73_m2} (ref >59)

## 2022-06-26 LAB — VITAMIN D 25 HYDROXY (VIT D DEFICIENCY, FRACTURES): Vit D, 25-Hydroxy: 7.6 ng/mL — ABNORMAL LOW (ref 30.0–100.0)

## 2022-06-26 LAB — HIV ANTIBODY (ROUTINE TESTING W REFLEX): HIV Screen 4th Generation wRfx: NONREACTIVE

## 2022-06-26 LAB — LIPID PANEL
Chol/HDL Ratio: 3.6 ratio (ref 0.0–4.4)
Cholesterol, Total: 180 mg/dL (ref 100–199)
HDL: 50 mg/dL (ref >39)
LDL Chol Calc (NIH): 118 mg/dL — ABNORMAL HIGH (ref 0–99)
Triglycerides: 63 mg/dL (ref 0–149)
VLDL Cholesterol Cal: 12 mg/dL (ref 5–40)

## 2022-06-26 LAB — TSH+FREE T4
Free T4: 1.48 ng/dL (ref 0.82–1.77)
TSH: 4.75 u[IU]/mL — ABNORMAL HIGH (ref 0.450–4.500)

## 2022-06-26 LAB — HEMOGLOBIN A1C
Est. average glucose Bld gHb Est-mCnc: 103 mg/dL
Hgb A1c MFr Bld: 5.2 % (ref 4.8–5.6)

## 2022-06-26 LAB — HEPATITIS C ANTIBODY: Hep C Virus Ab: NONREACTIVE

## 2022-06-30 ENCOUNTER — Other Ambulatory Visit: Payer: Self-pay | Admitting: Family Medicine

## 2022-06-30 DIAGNOSIS — E559 Vitamin D deficiency, unspecified: Secondary | ICD-10-CM

## 2022-06-30 MED ORDER — VITAMIN D (ERGOCALCIFEROL) 1.25 MG (50000 UNIT) PO CAPS: 50000.0000 [IU] | ORAL_CAPSULE | ORAL | 3 refills | Status: DC

## 2022-07-19 ENCOUNTER — Encounter: Payer: BC Managed Care – PPO | Admitting: Nurse Practitioner

## 2022-08-14 ENCOUNTER — Ambulatory Visit
Admission: RE | Admit: 2022-08-14 | Discharge: 2022-08-14 | Disposition: A | Discharge status: Home / Self Care | Payer: Medicaid Other | Source: Ambulatory Visit

## 2022-08-14 VITALS — BP 126/84 | HR 71 | Temp 98.7°F | Resp 16

## 2022-08-14 DIAGNOSIS — M25531 Pain in right wrist: Secondary | ICD-10-CM | POA: Diagnosis not present

## 2022-08-14 MED ORDER — KETOROLAC TROMETHAMINE 30 MG/ML IJ SOLN
30.0000 mg | Freq: Once | INTRAMUSCULAR | Status: AC
  Administered 2022-08-14: 30 mg via INTRAMUSCULAR

## 2022-08-14 NOTE — ED Triage Notes (Signed)
Pt reports pain and swelling in right wrist x 6 days when she was at work. Ibuprofen gives relief with swelling.

## 2022-08-14 NOTE — Discharge Instructions (Addendum)
We have given you a shot of Toradol today for the wrist pain. Do not take any NSAIDs (ibuprofen, Aleve, naproxen, BC goody, Advil) for the next 48 hours.  You can take Tylenol [phone] mg every 6 hours as needed for pain.    We have also given you a wrist splint, please wear the splint when the wrist is bothering you.    Follow up with a Hand Specialist (contact information is below) if the pain does not improve in the next 1-2 weeks or if the pain worsens despite treatment.

## 2022-08-14 NOTE — ED Provider Notes (Signed)
[facility]    CSN: [id number] Arrival date & time: 08/14/22  0950      History   Chief Complaint Chief Complaint  Patient presents with   Wrist Pain    Wrist pain started at work on Thursday January 18th. - Entered by patient   Appointment    1000    HPI Chelsea Serrano is a 27 y.o. female.   Patient presents today for wrist pain that began approximately 5 days ago while at work.  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  Reports she thinks she may have twisted her wrist funny while at work.  No recent fall, accident, injury, or specific trauma to the wrist.  Reports initially, her wrist was swollen, however this is now better.  No bruising or redness to the wrist.  No numbness or tingling in the fingertips.  No decreased strength in the hand or decreased sensation.  Has been taking ibuprofen for the pain which helps temporarily.  LMP: 08/07/22    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   CLOS FRACTURE MID/PROXIMAL PHALANX/PHALANG HAND 06/06/2010   Qualifier: Diagnosis of  By: Aline Brochure MD, Stanley     Flat foot 03/09/2021   Migraine     Patient Active Problem List   Diagnosis Date Noted   Need for immunization against influenza 06/25/2022   Overweight (BMI 25.0-29.9) 12/25/2021   Cervical cancer screening 07/18/2021   Cerumen impaction 07/18/2021   Hyperbilirubinemia 07/18/2021   HLD (hyperlipidemia) 07/18/2021   Encounter for general adult medical examination with abnormal findings 04/20/2021   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      Home Medications    Prior to Admission medications   Medication Sig Start Date End Date Taking? Authorizing Provider  ibuprofen (ADVIL) 200 MG tablet Take 200 mg by mouth every 6  (six) hours as needed.   Yes [provider]  [place] 28 0.25-35 MG-MCG tablet Take 1 tablet by mouth daily. 10/30/20   [provider]  valACYclovir (VALTREX) 500 MG tablet Take 1 tablet (500 mg total) by mouth every 12 (twelve) hours. Take for recurrence of symptoms. Take as soon as possible within 24 hours of symptom onset. 04/20/21   Noreene Larsson, NP  Vitamin D, Ergocalciferol, (DRISDOL) 1.25 MG (50000 UNIT) CAPS capsule Take 1 capsule (50,000 Units total) by mouth every 7 (seven) days. 06/30/22   Alvira Monday, FNP  Zinc Oxide 40 % PSTE Apply to affected area up to TID AS NEEDED 04/20/21   Noreene Larsson, NP    Family History Family History  Problem Relation Age of Onset   Healthy Mother    Healthy Father    Cancer Maternal Grandmother    Cancer Paternal Grandmother     Social History Social History   Tobacco Use   Smoking status: Never   Smokeless tobacco: Never  Vaping Use   Vaping Use: Never used  Substance Use Topics   Alcohol use: No   Drug use: No     Allergies   Patient has no known allergies.   Review of Systems Review of Systems Per HPI  Physical Exam Triage Vital Signs ED Triage Vitals  Enc Vitals Group  BP 08/14/22 1016 126/84     Pulse Rate 08/14/22 1016 71     Resp 08/14/22 1016 16     Temp 08/14/22 1016 98.7 F (37.1 C)     Temp Source 08/14/22 1016 Oral     SpO2 08/14/22 1016 98 %     Weight --      Height --      Head Circumference --      Peak Flow --      Pain Score 08/14/22 1015 6     Pain Loc --      Pain Edu? --      Excl. in [place]? --    No data found.  Updated Vital Signs BP 126/84 (BP Location: Right Arm)   Pulse 71   Temp 98.7 F (37.1 C) (Oral)   Resp 16   SpO2 98%   Visual Acuity Right Eye Distance:   Left Eye Distance:   Bilateral Distance:    Right Eye Near:   Left Eye Near:    Bilateral Near:     Physical Exam Vitals and nursing note reviewed.  Constitutional:      General: She is  not in acute distress.    Appearance: Normal appearance. She is not toxic-appearing.  HENT:     Mouth/Throat:     Mouth: Mucous membranes are moist.     Pharynx: Oropharynx is clear.  Pulmonary:     Effort: Pulmonary effort is normal. No respiratory distress.  Musculoskeletal:     Right wrist: Tenderness present. No swelling, deformity, bony tenderness or snuff box tenderness. Normal range of motion. Normal pulse.     Left wrist: Normal.     Right hand: Normal.     Left hand: Normal.       Hands:     Comments: Inspection: no swelling, obvious deformity, or redness to bilateral wrists and hands Palpation: right wrist tender to palpation in area marked; no obvious deformities palpated ROM: Full ROM to right wrist and all 5 digits Strength: 5/5 bilateral upper extremities Neurovascular: neurovascularly intact in left and right upper extremity   Skin:    General: Skin is warm and dry.     Capillary Refill: Capillary refill takes less than 2 seconds.     Coloration: Skin is not jaundiced or pale.     Findings: No erythema.  Neurological:     Mental Status: She is alert and oriented to person, place, and time.  Psychiatric:        Behavior: Behavior is cooperative.      UC Treatments / Results  Labs (all labs ordered are listed, but only abnormal results are displayed) Labs Reviewed - No data to display  EKG   Radiology No results found.  Procedures Procedures (including critical care time)  Medications Ordered in UC Medications  ketorolac (TORADOL) 30 MG/ML injection 30 mg (has no administration in time range)    Initial Impression / Assessment and Plan / UC Course  I have reviewed the triage vital signs and the nursing notes.  Pertinent labs & imaging results that were available during my care of the patient were reviewed by me and considered in my medical decision making (see chart for details).   Patient is well-appearing, normotensive, afebrile, not  tachycardic, not tachypneic, oxygenating well on room air.    Right wrist pain Suspect wrist strain vs. Tendinitis Treat with rest, NSAIDs, ice, elevation Can use wrist brace as needed Toradol 30 mg IM given today  for pain control - discussed to not take NSAIDs for the next 48 hours Note given for work Follow up with Ortho with no improvement or worsening of symptoms despite treatment  The patient was given the opportunity to ask questions.  All questions answered to their satisfaction.  The patient is in agreement to this plan.    Final Clinical Impressions(s) / UC Diagnoses   Final diagnoses:  Right wrist pain     Discharge Instructions      We have given you a shot of Toradol today for the wrist pain. Do not take any NSAIDs (ibuprofen, Aleve, naproxen, BC goody, Advil) for the next 48 hours.  You can take Tylenol [phone] mg every 6 hours as needed for pain.    We have also given you a wrist splint, please wear the splint when the wrist is bothering you.    Follow up with a Hand Specialist (contact information is below) if the pain does not improve in the next 1-2 weeks or if the pain worsens despite treatment.      ED Prescriptions   None    PDMP not reviewed this encounter.   Valentino Nose, NP 08/14/22 1050

## 2022-11-25 ENCOUNTER — Ambulatory Visit: Payer: Medicaid Other | Admitting: Family Medicine

## 2022-12-03 ENCOUNTER — Ambulatory Visit
Admission: EM | Admit: 2022-12-03 | Discharge: 2022-12-03 | Disposition: A | Discharge status: Home / Self Care | Payer: BC Managed Care – PPO | Attending: Nurse Practitioner | Admitting: Nurse Practitioner

## 2022-12-03 ENCOUNTER — Encounter: Payer: Self-pay | Admitting: Emergency Medicine

## 2022-12-03 ENCOUNTER — Other Ambulatory Visit: Payer: Self-pay

## 2022-12-03 DIAGNOSIS — R0781 Pleurodynia: Secondary | ICD-10-CM | POA: Diagnosis not present

## 2022-12-03 MED ORDER — KETOROLAC TROMETHAMINE 30 MG/ML IJ SOLN
30.0000 mg | Freq: Once | INTRAMUSCULAR | Status: AC
  Administered 2022-12-03: 30 mg via INTRAMUSCULAR

## 2022-12-03 NOTE — ED Triage Notes (Signed)
Pt reports reports intermittent upper left abdominal pain. Denies any urinary symptoms, fever, chills, n/v/d.

## 2022-12-03 NOTE — Discharge Instructions (Signed)
As we discussed, the pain in your ribs may be coming from straining or lifting heavy objects.  You do not have any signs or symptoms of pneumonia today.  We have given you a shot of Toradol today for pain.  You can take Tylenol or ibuprofen as needed.  Please follow-up if you develop symptoms such as cough, fever, body aches/chills, difficulty breathing.

## 2022-12-03 NOTE — ED Provider Notes (Signed)
[facility]    CSN: [id number] Arrival date & time: 12/03/22  0802      History   Chief Complaint Chief Complaint  Patient presents with   Abdominal Pain    HPI Chelsea Serrano is a 27 y.o. female.   Patient presents with LUQ abdominal pain ongoing since yesterday.  Reports the pain onset is gradual and severity is mild - currently a 3/10.  Describes the pain as cramping.  The pain lasts hours.  The patient denies radiation of pain. The patient denies fever, body aches/chills, cough, congestion, sore throat, nausea/vomiting, blood in stool, rash, dysuria/urinary frequency, urinary urgency, change in color of the urine, foul urinary odor, and hematuria. The patient endorses decreased appetite and 1 episode of nonbloody diarrhea yesterday. Has not tried anything OTC so far for symptoms.  Reports when she stands straight up, the pain gets better.  Reports the last time she had similar pain, she had pneumonia in her right lung.    Patient denies any recent injury to the rib cage.  Reports that she recently started working a second job and lifts a lot of heavy boxes.    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       Home Medications    Prior to Admission medications   Medication Sig Start Date End Date Taking? Authorizing Provider  ibuprofen (ADVIL) 200 MG tablet Take 200 mg by mouth every 6 (six) hours as needed.    [provider]  SPRINTEC 28 0.25-35 MG-MCG tablet Take 1 tablet by mouth daily. 10/30/20   [provider]  valACYclovir (VALTREX) 500 MG tablet Take 1 tablet (500 mg total) by mouth every 12 (twelve) hours. Take for recurrence of symptoms. Take as soon as possible within 24 hours of symptom onset. 04/20/21   Heather Roberts, NP  Vitamin D, Ergocalciferol, (DRISDOL) 1.25 MG (50000 UNIT) CAPS capsule Take 1 capsule (50,000 Units total) by mouth every 7 (seven) days. 06/30/22   Gilmore Laroche, FNP  Zinc Oxide 40 % PSTE Apply to affected area up to TID AS NEEDED 04/20/21   Heather Roberts, NP    Family History Family History  Problem Relation Age of Onset   Healthy Mother    Healthy Father    Cancer Maternal Grandmother    Cancer Paternal Grandmother     Social History Social History   Tobacco Use   Smoking status: Never   Smokeless tobacco: Never  Vaping Use   Vaping Use: Never used  Substance Use Topics   Alcohol use: No   Drug use: No     Allergies   Patient has no known allergies.   Review of Systems Review  of Systems Per HPI  Physical Exam Triage Vital Signs ED Triage Vitals  Enc Vitals Group     BP 12/03/22 0819 130/84     Pulse Rate 12/03/22 0819 73     Resp 12/03/22 0819 20     Temp 12/03/22 0819 98.2 F (36.8 C)     Temp Source 12/03/22 0819 Oral     SpO2 12/03/22 0819 97 %     Weight --      Height --      Head Circumference --      Peak Flow --      Pain Score 12/03/22 0818 3     Pain Loc --      Pain Edu? --      Excl. in GC? --    No data found.  Updated Vital Signs BP 130/84 (BP Location: Right Arm)   Pulse 73   Temp 98.2 F (36.8 C) (Oral)   Resp 20   LMP 12/03/2022 (Approximate)   SpO2 97%   Visual  Acuity Right Eye Distance:   Left Eye Distance:   Bilateral Distance:    Right Eye Near:   Left Eye Near:    Bilateral Near:     Physical Exam Vitals and nursing note reviewed.  Constitutional:      General: She is not in acute distress.    Appearance: Normal appearance. She is not toxic-appearing.  HENT:     Head: Normocephalic and atraumatic.     Mouth/Throat:     Mouth: Mucous membranes are moist.     Pharynx: Oropharynx is clear.  Eyes:     General: No scleral icterus.    Extraocular Movements: Extraocular movements intact.  Cardiovascular:     Rate and Rhythm: Normal rate and regular rhythm.  Pulmonary:     Effort: Pulmonary effort is normal. No respiratory distress.     Breath sounds: Normal breath sounds. No wheezing, rhonchi or rales.  Abdominal:     General: Abdomen is flat. Bowel sounds are normal. There is no distension.     Palpations: Abdomen is soft.     Tenderness: There is no abdominal tenderness. There is no right CVA tenderness, left CVA tenderness, guarding or rebound. Negative signs include Murphy's sign and McBurney's sign.       Comments: Tenderness to palpation of left rib cage in approximately area marked; no bruising, redness, swelling, point tenderness  Musculoskeletal:     Cervical back: Normal range of motion.  Lymphadenopathy:     Cervical: No cervical adenopathy.  Skin:    General: Skin is warm and dry.     Capillary Refill: Capillary refill takes less than 2 seconds.     Coloration: Skin is not jaundiced or pale.     Findings: No erythema.  Neurological:     Mental Status: She is alert and oriented to person, place, and time.  Psychiatric:        Behavior: Behavior is cooperative.      UC Treatments / Results  Labs (all labs ordered are listed, but only abnormal results are displayed) Labs Reviewed - No data to display  EKG   Radiology No results found.  Procedures Procedures (including critical care time)  Medications  Ordered in UC Medications  ketorolac (TORADOL) 30 MG/ML injection 30 mg (30 mg Intramuscular Given 12/03/22 0845)    Initial Impression / Assessment and Plan / UC Course  I have reviewed the triage vital signs and the nursing notes.  Pertinent labs & imaging results that were available during my care of the patient were reviewed by me and considered in my medical decision making (see chart for details).   Patient is well-appearing, normotensive, afebrile, not tachycardic, not tachypneic, oxygenating well on room air.    1. Rib pain on left side Reassurance provided to patient-no signs of acute abdomen on exam today and lungs are clear to auscultation Chest x-ray deferred today using shared patient decision making Toradol 30 mg IM given today in urgent care for pain control Recommended continued use of Tylenol or ibuprofen as needed for pain Seek care for persistent or worsening symptoms despite treatment  The patient was given the opportunity to ask questions.  All questions answered to their satisfaction.  The patient is in agreement to this plan.    Final Clinical Impressions(s) / UC Diagnoses   Final diagnoses:  Rib pain on left side     Discharge Instructions      As we discussed, the pain in your ribs may be coming from straining or lifting heavy objects.  You do not have any signs or symptoms of pneumonia today.  We have given you a shot of Toradol today for pain.  You can take Tylenol or ibuprofen as needed.  Please follow-up if you develop symptoms such as cough, fever, body aches/chills, difficulty breathing.     ED Prescriptions   None    PDMP not reviewed this encounter.   Valentino Nose, NP 12/03/22 1243

## 2022-12-13 ENCOUNTER — Ambulatory Visit: Payer: BC Managed Care – PPO | Admitting: Family Medicine

## 2022-12-13 ENCOUNTER — Encounter: Payer: Self-pay | Admitting: Family Medicine

## 2022-12-13 VITALS — BP 122/78 | HR 95 | Ht 64.0 in | Wt 163.6 lb

## 2022-12-13 DIAGNOSIS — M25531 Pain in right wrist: Secondary | ICD-10-CM | POA: Diagnosis not present

## 2022-12-13 DIAGNOSIS — E038 Other specified hypothyroidism: Secondary | ICD-10-CM | POA: Diagnosis not present

## 2022-12-13 DIAGNOSIS — E7849 Other hyperlipidemia: Secondary | ICD-10-CM | POA: Diagnosis not present

## 2022-12-13 DIAGNOSIS — E559 Vitamin D deficiency, unspecified: Secondary | ICD-10-CM | POA: Diagnosis not present

## 2022-12-13 DIAGNOSIS — R7301 Impaired fasting glucose: Secondary | ICD-10-CM

## 2022-12-13 MED ORDER — VITAMIN D (ERGOCALCIFEROL) 1.25 MG (50000 UNIT) PO CAPS: 50000.0000 [IU] | ORAL_CAPSULE | ORAL | 3 refills | Status: DC

## 2022-12-13 MED ORDER — IBUPROFEN 800 MG PO TABS: 800.0000 mg | ORAL_TABLET | Freq: Three times a day (TID) | ORAL | 1 refills | Status: DC | PRN

## 2022-12-13 NOTE — Assessment & Plan Note (Signed)
Symptoms likely of de quervain tenosynovitis due to overuse Encouraged to rest, ice, elevation, and use of wrist splint Encouraged to start taking ibuprofen 800 mg every 8 hours as needed

## 2022-12-13 NOTE — Progress Notes (Signed)
Established Patient Office Visit  Subjective:  Patient ID: Chelsea Serrano, female    DOB: 03-17-1996  Age: 27 y.o. MRN: [id number]  CC:  Chief Complaint  Patient presents with   Chronic Care Management    5 month f/u   Wrist Pain    Pt reports flare up on right wrist uses otc tylenol to relieve sx, has worsened since 08/2022    HPI Chelsea Serrano is a 27 y.o. female with past medical history of HSV-2, hyperlipidemia, and overweight presents for f/u of  chronic medical conditions.  Right wrist pain: She was seen in the ED on 08/14/2022 with similar complaints.  She complains of right wrist pain on the radial side of the wrist.  Reports living healthy boxes at work and has not been using her wrist splint.  No recent injury or trauma reported.  No edema or erythema noted.  No reports of numbness or tingling in the fingers.  ADLs is intact with no deformity or limitation reported.   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   Migraine     Past Surgical History:  Procedure Laterality Date   NO PAST SURGERIES      Family History  Problem Relation Age of Onset   Healthy Mother    Healthy Father    Cancer Maternal Grandmother    Cancer Paternal Grandmother     Social History   Socioeconomic History   Marital status: Single    Spouse name: Not on file   Number of children: 0   Years of education: Not on file   Highest education level: Not on file  Occupational History   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  Tobacco Use   Smoking status: Never   Smokeless tobacco: Never  Vaping Use   Vaping Use: Never used  Substance and Sexual Activity   Alcohol use: No   Drug use: No   Sexual activity: Yes    Birth control/protection: Pill  Other Topics  Concern   Not on file  Social History Narrative   Not on file   Social Determinants of Health   Financial Resource Strain: Not on file  Food Insecurity: Not on file  Transportation Needs: Not on file  Physical Activity: Not on file  Stress: Not on file  Social Connections: Not on file  Intimate Partner Violence: Not on file    Outpatient Medications Prior to Visit  Medication Sig Dispense Refill   SPRINTEC 28 0.25-35 MG-MCG tablet Take 1 tablet by mouth daily.     valACYclovir (VALTREX) 500 MG tablet Take 1 tablet (500 mg total) by mouth every 12 (twelve) hours. Take for recurrence of symptoms. Take as soon as possible within 24 hours of symptom onset. 6 tablet 5   Zinc Oxide 40 % PSTE Apply to affected area up to TID AS NEEDED 28 g 1   ibuprofen (ADVIL) 200 MG tablet Take 200 mg by mouth every 6 (six) hours as needed.     Vitamin D, Ergocalciferol, (DRISDOL) 1.25 MG (50000 UNIT) CAPS capsule Take 1 capsule (50,000 Units total) by mouth every 7 (seven) days. 7 capsule 3   No facility-administered medications prior to visit.    No Known Allergies  ROS Review of Systems  Constitutional:  Negative for chills and fever.  Eyes:  Negative for visual disturbance.  Respiratory:  Negative for chest tightness and shortness of breath.   Musculoskeletal:        Right wrist pain  Neurological:  Negative for dizziness and headaches.      Objective:    Physical Exam HENT:     Head: Normocephalic.     Mouth/Throat:     Mouth: Mucous membranes are moist.  Cardiovascular:     Rate and Rhythm: Normal rate.     Heart sounds: Normal heart sounds.  Pulmonary:     Effort: Pulmonary effort is normal.     Breath sounds: Normal breath sounds.  Musculoskeletal:       Arms:     Comments: Right wrist tenderness to palpation in the area marked.  No swelling, obvious deformity, redness to the bilateral wrist and hands full range of motion of right wrist and all 5 digits.  Positive  Finkelstein test  Neurological:     Mental Status: She is alert.     BP 122/78   Pulse 95   Ht 5\' 4"  (1.626 m)   Wt 163 lb 9.6 oz (74.2 kg)   LMP 12/03/2022 (Approximate)   SpO2 99%   BMI 28.08 kg/m  Wt Readings from Last 3 Encounters:  12/13/22 163 lb 9.6 oz (74.2 kg)  06/25/22 160 lb 0.6 oz (72.6 kg)  12/25/21 154 lb (69.9 kg)    Lab Results  Component Value Date   TSH 4.750 (H) 06/25/2022   Lab Results  Component Value Date   WBC 4.6 06/25/2022   HGB 12.1 06/25/2022   HCT 36.9 06/25/2022   MCV 84 06/25/2022   PLT 131 (L) 06/25/2022   Lab Results  Component Value Date   NA 137 06/25/2022   K 3.9 06/25/2022   CO2 21 06/25/2022   GLUCOSE 90 06/25/2022   BUN 11 06/25/2022   CREATININE 0.75 06/25/2022   BILITOT 0.4 06/25/2022   ALKPHOS 50 06/25/2022   AST 18 06/25/2022   ALT 13 06/25/2022   PROT 7.0 06/25/2022   ALBUMIN 4.1 06/25/2022   CALCIUM 9.0 06/25/2022   EGFR 113 06/25/2022   Lab Results  Component Value Date   CHOL 180 06/25/2022   Lab Results  Component Value Date   HDL 50 06/25/2022   Lab Results  Component Value Date   LDLCALC 118 (H) 06/25/2022   Lab Results  Component Value Date   TRIG 63 06/25/2022   Lab Results  Component Value Date   CHOLHDL 3.6 06/25/2022   Lab Results  Component Value Date   HGBA1C 5.2 06/25/2022      Assessment & Plan:  Right wrist pain Assessment & Plan: Symptoms likely of de quervain tenosynovitis due to overuse Encouraged to rest, ice, elevation, and use of wrist splint Encouraged to start taking ibuprofen 800 mg every 8 hours as needed   Orders: -     Ibuprofen; Take 1 tablet (800 mg total) by mouth every 8 (eight) hours as needed.  Dispense: 30 tablet; Refill: 1  Other hyperlipidemia Assessment & Plan: Pending lipid panel Lab Results  Component Value Date   CHOL 180 06/25/2022   HDL 50 06/25/2022   LDLCALC 118 (H) 06/25/2022   TRIG 63 06/25/2022   CHOLHDL 3.6 06/25/2022      Orders: -     Lipid panel -     CMP14+EGFR -     CBC with Differential/Platelet  Vitamin D deficiency -  Vitamin D (Ergocalciferol); Take 1 capsule (50,000 Units total) by mouth every 7 (seven) days.  Dispense: 7 capsule; Refill: 3  IFG (impaired fasting glucose) -     Hemoglobin A1c -     VITAMIN D 25 Hydroxy (Vit-D Deficiency, Fractures)  Other specified hypothyroidism -     TSH + free T4    Follow-up: Return in about 5 months (around 05/15/2023).   Gilmore Laroche, FNP

## 2022-12-13 NOTE — Patient Instructions (Addendum)
I appreciate the opportunity to provide care to you today!    Follow up:  5 months  Labs: please stop by the lab today/ during the week to get your blood drawn (CBC, CMP, TSH, Lipid profile, HgA1c, Vit D)  De Quervain TendinopathyTreatment Avoiding any activity that causes pain and swelling. Taking medicines. Anti-inflammatory medicines  to reduce inflammation and relieve pain. Wearing a splint.   Please continue to a heart-healthy diet and increase your physical activities. Try to exercise for at least five days a week.      It was a pleasure to see you and I look forward to continuing to work together on your health and well-being. Please do not hesitate to call the office if you need care or have questions about your care.   Have a wonderful day and week. With Gratitude, Gilmore Laroche MSN, FNP-BC

## 2022-12-13 NOTE — Assessment & Plan Note (Signed)
Pending lipid panel Lab Results  Component Value Date   CHOL 180 06/25/2022   HDL 50 06/25/2022   LDLCALC 118 (H) 06/25/2022   TRIG 63 06/25/2022   CHOLHDL 3.6 06/25/2022

## 2022-12-14 LAB — CBC WITH DIFFERENTIAL/PLATELET
Basophils Absolute: 0 10*3/uL (ref 0.0–0.2)
Basos: 1 %
EOS (ABSOLUTE): 0.1 10*3/uL (ref 0.0–0.4)
Eos: 1 %
Hematocrit: 37.2 % (ref 34.0–46.6)
Hemoglobin: 12 g/dL (ref 11.1–15.9)
Immature Grans (Abs): 0 10*3/uL (ref 0.0–0.1)
Immature Granulocytes: 0 %
Lymphocytes Absolute: 2.1 10*3/uL (ref 0.7–3.1)
Lymphs: 43 %
MCH: 27.2 pg (ref 26.6–33.0)
MCHC: 32.3 g/dL (ref 31.5–35.7)
MCV: 84 fL (ref 79–97)
Monocytes Absolute: 0.4 10*3/uL (ref 0.1–0.9)
Monocytes: 9 %
Neutrophils Absolute: 2.3 10*3/uL (ref 1.4–7.0)
Neutrophils: 46 %
Platelets: 155 10*3/uL (ref 150–450)
RBC: 4.41 x10E6/uL (ref 3.77–5.28)
RDW: 12 % (ref 11.7–15.4)
WBC: 4.9 10*3/uL (ref 3.4–10.8)

## 2022-12-14 LAB — LIPID PANEL
Chol/HDL Ratio: 3.4 ratio (ref 0.0–4.4)
Cholesterol, Total: 170 mg/dL (ref 100–199)
HDL: 50 mg/dL (ref >39)
LDL Chol Calc (NIH): 107 mg/dL — ABNORMAL HIGH (ref 0–99)
Triglycerides: 65 mg/dL (ref 0–149)
VLDL Cholesterol Cal: 13 mg/dL (ref 5–40)

## 2022-12-14 LAB — CMP14+EGFR
ALT: 11 IU/L (ref 0–32)
AST: 14 IU/L (ref 0–40)
Albumin/Globulin Ratio: 1.4 (ref 1.2–2.2)
Albumin: 4.1 g/dL (ref 4.0–5.0)
Alkaline Phosphatase: 40 IU/L — ABNORMAL LOW (ref 44–121)
BUN/Creatinine Ratio: 15 (ref 9–23)
BUN: 10 mg/dL (ref 6–20)
Bilirubin Total: 0.5 mg/dL (ref 0.0–1.2)
CO2: 21 mmol/L (ref 20–29)
Calcium: 9 mg/dL (ref 8.7–10.2)
Chloride: 102 mmol/L (ref 96–106)
Creatinine, Ser: 0.68 mg/dL (ref 0.57–1.00)
Globulin, Total: 2.9 g/dL (ref 1.5–4.5)
Glucose: 85 mg/dL (ref 70–99)
Potassium: 4.2 mmol/L (ref 3.5–5.2)
Sodium: 137 mmol/L (ref 134–144)
Total Protein: 7 g/dL (ref 6.0–8.5)
eGFR: 124 mL/min/{1.73_m2} (ref >59)

## 2022-12-14 LAB — VITAMIN D 25 HYDROXY (VIT D DEFICIENCY, FRACTURES): Vit D, 25-Hydroxy: 7 ng/mL — ABNORMAL LOW (ref 30.0–100.0)

## 2022-12-14 LAB — HEMOGLOBIN A1C
Est. average glucose Bld gHb Est-mCnc: 108 mg/dL
Hgb A1c MFr Bld: 5.4 % (ref 4.8–5.6)

## 2022-12-14 LAB — TSH+FREE T4
Free T4: 0.99 ng/dL (ref 0.82–1.77)
TSH: 3.28 u[IU]/mL (ref 0.450–4.500)

## 2022-12-17 ENCOUNTER — Other Ambulatory Visit: Payer: Self-pay | Admitting: Family Medicine

## 2022-12-17 DIAGNOSIS — E559 Vitamin D deficiency, unspecified: Secondary | ICD-10-CM

## 2022-12-17 MED ORDER — VITAMIN D (ERGOCALCIFEROL) 1.25 MG (50000 UNIT) PO CAPS: 50000.0000 [IU] | ORAL_CAPSULE | ORAL | 1 refills | Status: DC

## 2023-01-14 ENCOUNTER — Other Ambulatory Visit: Payer: Self-pay

## 2023-01-14 ENCOUNTER — Ambulatory Visit
Admission: RE | Admit: 2023-01-14 | Discharge: 2023-01-14 | Disposition: A | Discharge status: Home / Self Care | Payer: Medicaid Other | Source: Ambulatory Visit | Attending: Family Medicine | Admitting: Family Medicine

## 2023-01-14 VITALS — BP 124/81 | HR 73 | Temp 98.4°F | Resp 20

## 2023-01-14 DIAGNOSIS — R229 Localized swelling, mass and lump, unspecified: Secondary | ICD-10-CM | POA: Diagnosis not present

## 2023-01-14 DIAGNOSIS — N6459 Other signs and symptoms in breast: Secondary | ICD-10-CM | POA: Diagnosis not present

## 2023-01-14 NOTE — ED Triage Notes (Signed)
Pt reports "rash with pimples around it" on right breast x2 days.

## 2023-01-14 NOTE — ED Provider Notes (Signed)
[facility]    CSN: [id number] Arrival date & time: 01/14/23  0855      History   Chief Complaint Chief Complaint  Patient presents with   Breast Problem    Rash appeared Monday January 13, 2023 in the morning on right breast. - Entered by patient    HPI Chelsea Serrano is a 27 y.o. female.   Patient presents today with red rash to right breast that she noticed yesterday.  She reports the area is not itchy, not draining, does not burn, and she is not experiencing any pain associated with the rash.  She reports the rash has not changed in size since yesterday.  No nipple discharge or new breast lumps.  Last menstrual period approximately 2 weeks ago.  No history of similar.  No recent change in any detergents, soaps, or personal care products.  Has not tried anything for symptoms so far.    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      Home Medications    Prior to Admission medications   Medication Sig Start Date End Date Taking? Authorizing Provider  ibuprofen (ADVIL) 800 MG tablet Take 1 tablet (800 mg total) by mouth every 8 (eight) hours as needed. 12/13/22   Gilmore Laroche, FNP  SPRINTEC 28 0.25-35 MG-MCG tablet Take 1 tablet by mouth daily. 10/30/20   [provider]  valACYclovir (VALTREX) 500 MG tablet Take 1 tablet (500 mg total) by mouth every 12 (twelve) hours. Take for recurrence of symptoms. Take as soon as possible within 24 hours of symptom onset. 04/20/21   Heather Roberts, NP  Vitamin D, Ergocalciferol, (DRISDOL) 1.25 MG (50000 UNIT) CAPS capsule Take 1 capsule (50,000 Units total) by mouth every 7 (seven) days. 12/17/22   Gilmore Laroche, FNP  Zinc Oxide 40 % PSTE Apply to affected area up to TID AS NEEDED 04/20/21   Heather Roberts, NP    Family History Family History  Problem Relation Age of Onset   Healthy Mother    Healthy Father    Cancer Maternal Grandmother    Cancer Paternal Grandmother     Social History Social History   Tobacco Use   Smoking status: Never   Smokeless tobacco: Never  Vaping Use   Vaping Use: Never used  Substance Use Topics   Alcohol use: No   Drug use: No     Allergies   Patient has no known allergies.   Review of Systems Review of Systems Per HPI  Physical Exam Triage Vital Signs ED Triage Vitals  Enc Vitals Group     BP 01/14/23 0908 124/81     Pulse  Rate 01/14/23 0908 73     Resp 01/14/23 0908 20     Temp 01/14/23 0908 98.4 F (36.9 C)     Temp Source 01/14/23 0908 Oral     SpO2 01/14/23 0908 98 %     Weight --      Height --      Head Circumference --      Peak Flow --      Pain Score 01/14/23 0907 0     Pain Loc --      Pain Edu? --      Excl. in GC? --    No data found.  Updated Vital Signs BP 124/81 (BP Location: Right Arm)   Pulse 73   Temp 98.4 F (36.9 C) (Oral)   Resp 20   LMP 12/29/2022 (Approximate)   SpO2 98%   Visual Acuity Right Eye Distance:   Left Eye Distance:   Bilateral Distance:    Right Eye Near:   Left Eye Near:    Bilateral Near:     Physical Exam Vitals and nursing note reviewed. Exam conducted with a chaperone present Esperanza Richters, RN).  Constitutional:      General: She is not in acute distress.    Appearance: Normal appearance. She is not toxic-appearing.  HENT:     Mouth/Throat:     Mouth: Mucous membranes are moist.     Pharynx: Oropharynx is clear.  Pulmonary:     Effort: Pulmonary effort is normal. No respiratory distress.  Chest:     Comments: Erythematous papule with 1 cm surrounding erythema to right breast in approximately 9 o'clock position.  Multiple fine papules that are slightly erythematous near the 7 o'clock position of the nipple.  No active drainage, fluctuance, warmth.  Skin:    General: Skin is warm and dry.     Capillary Refill: Capillary refill takes less than 2 seconds.     Findings: Erythema present.  Neurological:     Mental Status: She is alert and oriented to person, place, and time.  Psychiatric:        Behavior: Behavior is cooperative.      UC Treatments / Results  Labs (all labs ordered are listed, but only abnormal results are displayed) Labs Reviewed - No data to display  EKG   Radiology No results found.  Procedures Procedures (including critical care time)  Medications Ordered in UC Medications - No data to display  Initial Impression / Assessment and Plan / UC Course  I have reviewed the triage vital signs and the nursing notes.  Pertinent labs & imaging results that were available during my care of the patient were reviewed by me and considered in my medical decision making (see chart for details).   Patient is well-appearing, normotensive, afebrile, not tachycardic, not tachypneic, oxygenating well on room air.    1. Localized soft tissue swelling 2. Breast complaint Examination consistent with possible insect bite versus contact dermatitis Treat with petroleum jelly, or other emollient Return and ER precautions discussed with patient  The patient was given the opportunity to ask questions.  All questions answered to their satisfaction.  The  patient is in agreement to this plan.    Final Clinical Impressions(s) / UC Diagnoses   Final diagnoses:  Localized soft tissue swelling  Breast complaint     Discharge Instructions      As we discussed, I suspect the red area on your right breast is a mosquito bite or other  insect.  Recommend using petroleum jelly on the area to help protect the skin.  If the area gets bigger in size, painful, or swells, please return to be seen.     ED Prescriptions   None    PDMP not reviewed this encounter.   Valentino Nose, NP 01/14/23 [phone]

## 2023-01-14 NOTE — Discharge Instructions (Addendum)
As we discussed, I suspect the red area on your right breast is a mosquito bite or other insect.  Recommend using petroleum jelly on the area to help protect the skin.  If the area gets bigger in size, painful, or swells, please return to be seen.

## 2023-04-02 DIAGNOSIS — Z30011 Encounter for initial prescription of contraceptive pills: Secondary | ICD-10-CM | POA: Diagnosis not present

## 2023-05-15 ENCOUNTER — Encounter: Payer: Self-pay | Admitting: Family Medicine

## 2023-05-15 ENCOUNTER — Ambulatory Visit: Payer: BC Managed Care – PPO | Admitting: Family Medicine

## 2023-05-15 VITALS — BP 118/79 | HR 76 | Resp 15 | Ht 65.0 in | Wt 174.8 lb

## 2023-05-15 DIAGNOSIS — E559 Vitamin D deficiency, unspecified: Secondary | ICD-10-CM

## 2023-05-15 DIAGNOSIS — R7301 Impaired fasting glucose: Secondary | ICD-10-CM | POA: Diagnosis not present

## 2023-05-15 DIAGNOSIS — Z23 Encounter for immunization: Secondary | ICD-10-CM | POA: Diagnosis not present

## 2023-05-15 DIAGNOSIS — E7849 Other hyperlipidemia: Secondary | ICD-10-CM

## 2023-05-15 DIAGNOSIS — Z0001 Encounter for general adult medical examination with abnormal findings: Secondary | ICD-10-CM | POA: Diagnosis not present

## 2023-05-15 DIAGNOSIS — E038 Other specified hypothyroidism: Secondary | ICD-10-CM

## 2023-05-15 NOTE — Progress Notes (Deleted)
Complete physical exam  Patient: Chelsea Serrano   DOB: Dec 23, 1995   27 y.o. Female  MRN: [id number]  Subjective:    Chief Complaint  Patient presents with   Follow-up    Has a wellness program form she needs completed     Chelsea Serrano is a 27 y.o. female who presents today for a complete physical exam. She reports consuming a general diet. The patient does not participate in regular exercise at present. She generally feels well. She reports sleeping well. She does not have additional problems to discuss today.    Most recent fall risk assessment:    05/15/2023    8:54 AM  Fall Risk   Falls in the past year? 0  Number falls in past yr: 0  Injury with Fall? 0     Most recent depression screenings:    05/15/2023    8:54 AM 12/13/2022    8:45 AM  PHQ 2/9 Scores  PHQ - 2 Score 0 0  PHQ- 9 Score  2    Dental: No current dental problems and Last dental visit: 08/2022  Patient Active Problem List   Diagnosis Date Noted   Encounter for annual general medical examination with abnormal findings in adult 05/15/2023   Encounter for immunization 05/15/2023   Right wrist pain 12/13/2022   Need for immunization against influenza 06/25/2022   Overweight (BMI 25.0-29.9) 12/25/2021   Cervical cancer screening 07/18/2021   Cerumen impaction 07/18/2021   Hyperbilirubinemia 07/18/2021   HLD (hyperlipidemia) 07/18/2021   Encounter for general adult medical examination with abnormal findings 04/20/2021   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   Migraine    Past Surgical History:  Procedure Laterality Date   NO PAST SURGERIES     Social History   Tobacco Use   Smoking status: Never   Smokeless tobacco: Never   Vaping Use   Vaping status: Never Used  Substance Use Topics   Alcohol use: No   Drug use: No   Social History   Socioeconomic History   Marital status: Single    Spouse name: Not on file   Number of children: 0   Years of education: Not on file   Highest education level: Not on file  Occupational History   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  Tobacco Use   Smoking status: Never   Smokeless tobacco: Never  Vaping Use   Vaping status: Never Used  Substance and Sexual Activity   Alcohol use: No   Drug use: No   Sexual activity: Yes    Birth control/protection: Pill  Other Topics Concern   Not on file  Social History Narrative   Not on file   Social Determinants of Health   Financial Resource Strain: Not on file  Food Insecurity: Not on file  Transportation Needs: Not on file  Physical Activity: Not on file  Stress: Not on file  Social Connections: Not on file  Intimate Partner Violence: Not on file   Family Status  Relation Name Status   Mother  Alive   Father  Alive   MGM  (Not Specified)       throat cancer   PGM  (Not Specified)  throat cancer   Oneal Grout  (Not Specified)       lung cancer  No partnership data on file   Family History  Problem Relation Age of Onset   Healthy Mother    Healthy Father    Cancer Maternal Grandmother    Cancer Paternal Grandmother    No Known Allergies    Patient Care Team: Gilmore Laroche, FNP as PCP - General (Family Medicine)   Outpatient Medications Prior to Visit  Medication Sig   SPRINTEC 28 0.25-35 MG-MCG tablet Take 1 tablet by mouth daily.   ibuprofen (ADVIL) 800 MG tablet Take 1 tablet (800 mg total) by mouth every 8 (eight) hours as needed. (Patient not taking: Reported on 05/15/2023)   valACYclovir (VALTREX) 500 MG tablet Take 1 tablet (500 mg total) by mouth every 12 (twelve) hours. Take for recurrence of symptoms. Take as soon as possible within 24 hours of symptom onset. (Patient  not taking: Reported on 05/15/2023)   Vitamin D, Ergocalciferol, (DRISDOL) 1.25 MG (50000 UNIT) CAPS capsule Take 1 capsule (50,000 Units total) by mouth every 7 (seven) days. (Patient not taking: Reported on 05/15/2023)   Zinc Oxide 40 % PSTE Apply to affected area up to TID AS NEEDED (Patient not taking: Reported on 05/15/2023)   No facility-administered medications prior to visit.    ROS     Objective:    BP 118/79   Pulse 76   Resp 15   Ht 5\' 5"  (1.651 m)   Wt 174 lb 12.8 oz (79.3 kg)   SpO2 98%   BMI 29.09 kg/m  BP Readings from Last 3 Encounters:  05/15/23 118/79  01/14/23 124/81  12/13/22 122/78   Wt Readings from Last 3 Encounters:  05/15/23 174 lb 12.8 oz (79.3 kg)  12/13/22 163 lb 9.6 oz (74.2 kg)  06/25/22 160 lb 0.6 oz (72.6 kg)      Physical Exam  No results found for any visits on 05/15/23. Last CBC Lab Results  Component Value Date   WBC 4.9 12/13/2022   HGB 12.0 12/13/2022   HCT 37.2 12/13/2022   MCV 84 12/13/2022   MCH 27.2 12/13/2022   RDW 12.0 12/13/2022   PLT 155 12/13/2022   Last metabolic panel Lab Results  Component Value Date   GLUCOSE 85 12/13/2022   NA 137 12/13/2022   K 4.2 12/13/2022   CL 102 12/13/2022   CO2 21 12/13/2022   BUN 10 12/13/2022   CREATININE 0.68 12/13/2022   EGFR 124 12/13/2022   CALCIUM 9.0 12/13/2022   PROT 7.0 12/13/2022   ALBUMIN 4.1 12/13/2022   LABGLOB 2.9 12/13/2022   AGRATIO 1.4 12/13/2022   BILITOT 0.5 12/13/2022   ALKPHOS 40 (L) 12/13/2022   AST 14 12/13/2022   ALT 11 12/13/2022   Last lipids Lab Results  Component Value Date   CHOL 170 12/13/2022   HDL 50 12/13/2022   LDLCALC 107 (H) 12/13/2022   TRIG 65 12/13/2022   CHOLHDL 3.4 12/13/2022   Last hemoglobin A1c Lab Results  Component Value Date   HGBA1C 5.4 12/13/2022   Last thyroid functions Lab Results  Component Value Date   TSH 3.280 12/13/2022   Last vitamin D Lab Results  Component Value Date   VD25OH 7.0 (L) 12/13/2022    Last vitamin B12 and Folate No results found for: "VITAMINB12", "FOLATE"      Assessment & Plan:    Routine Health Maintenance and Physical Exam  Immunization History  Administered Date(s)  Administered   DTaP 06/06/1997, 08/12/1997, 10/13/1997, 07/26/1998, 11/11/2001   HIB (PRP-OMP) 06/06/1997, 08/12/1997, 10/13/1997, 05/16/1998   HPV 9-valent 11/21/2015   HPV Quadrivalent 07/21/2013, 07/28/2014   Hepatitis A 05/03/2010, 05/09/2011   Hepatitis B 04/22/1997, 05/23/1997, 10/13/1997   IPV 06/06/1997, 08/12/1997, 10/13/1997, 11/11/2001   Influenza,Quad,Nasal, Live 07/21/2013, 07/28/2014   Influenza,inj,Quad PF,6+ Mos 04/20/2021, 06/25/2022   MMR 05/16/1998, 11/11/2001   Meningococcal Conjugate 02/13/2009, 07/21/2013   PPD Test 11/17/2019   Tdap 02/13/2009, 07/19/2021   Varicella 05/16/1998, 05/09/2011    Health Maintenance  Topic Date Due   INFLUENZA VACCINE  02/20/2023   COVID-19 Vaccine (1 - 2023-24 season) Never done   Cervical Cancer Screening (Pap smear)  07/18/2024   DTaP/Tdap/Td (8 - Td or Tdap) 07/20/2031   HPV VACCINES  Completed   Hepatitis C Screening  Completed   HIV Screening  Completed    Discussed health benefits of physical activity, and encouraged her to engage in regular exercise appropriate for her age and condition.  Encounter for general adult medical examination with abnormal findings Assessment & Plan: Physical exam as documented Discussed heart-healthy diet  Encouraged to Exercise: If you are able: 30 -60 minutes a day ,4 days a week, or 150 minutes a week. The longer the better. Combine stretch, strength, and aerobic activities Encourage to eat whole Food, Plant Predominant Nutrition is highly recommended: Eat Plenty of vegetables, Mushrooms, fruits, Legumes, Whole Grains, Nuts, seeds in lieu of processed meats, processed snacks/pastries red meat, poultry, eggs.  Will f/u in 1 year for CPE      Encounter for immunization Assessment &  Plan: Patient educated on CDC recommendation for the vaccine. Verbal consent was obtained from the patient, vaccine administered by nurse, no sign of adverse reactions noted at this time. Patient education on arm soreness and use of tylenol or ibuprofen for this patient  was discussed. Patient educated on the signs and symptoms of adverse effect and advise to contact the office if they occur.    IFG (impaired fasting glucose) -     Hemoglobin A1c  Vitamin D deficiency -     VITAMIN D 25 Hydroxy (Vit-D Deficiency, Fractures)  TSH (thyroid-stimulating hormone deficiency) -     TSH + free T4  Other hyperlipidemia -     Lipid panel -     CMP14+EGFR -     CBC with Differential/Platelet  Encounter for annual general medical examination with abnormal findings in adult   Note: This chart has been completed using Engineer, civil (consulting) software, and while attempts have been made to ensure accuracy, certain words and phrases may not be transcribed as intended.   No follow-ups on file.     Gilmore Laroche, FNP

## 2023-05-15 NOTE — Assessment & Plan Note (Signed)

## 2023-05-15 NOTE — Assessment & Plan Note (Signed)
Patient educated on CDC recommendation for the vaccine. Verbal consent was obtained from the patient, vaccine administered by nurse, no sign of adverse reactions noted at this time. Patient education on arm soreness and use of tylenol or ibuprofen for this patient  was discussed. Patient educated on the signs and symptoms of adverse effect and advise to contact the office if they occur.  

## 2023-05-15 NOTE — Progress Notes (Signed)
Established Patient Office Visit  Subjective:  Patient ID: Chelsea Serrano, female    DOB: 01-21-1996  Age: 27 y.o. MRN: [id number]  CC:  Chief Complaint  Patient presents with   Follow-up    Has a wellness program form she needs completed     HPI Chelsea Serrano is a 27 y.o. female with past medical history of vitamin D deficiency presents for f/u. For the details of today's visit, please refer to the assessment and plan.    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   Migraine     Past Surgical History:  Procedure Laterality Date   NO PAST SURGERIES      Family History  Problem Relation Age of Onset   Healthy Mother    Healthy Father    Cancer Maternal Grandmother    Cancer Paternal Grandmother     Social History   Socioeconomic History   Marital status: Single    Spouse name: Not on file   Number of children: 0   Years of education: Not on file   Highest education level: Not on file  Occupational History   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  Tobacco Use   Smoking status: Never   Smokeless tobacco: Never  Vaping Use   Vaping status: Never Used  Substance and Sexual Activity   Alcohol use: No   Drug use: No   Sexual activity: Yes    Birth control/protection: Pill  Other Topics Concern   Not on file  Social History Narrative   Not on file   Social Determinants of Health   Financial Resource Strain: Not on file  Food Insecurity: Not on file  Transportation Needs: Not on file  Physical Activity: Not on file  Stress: Not on file  Social Connections: Not on file  Intimate Partner Violence: Not on file    Outpatient Medications Prior to Visit  Medication Sig Dispense Refill   SPRINTEC 28 0.25-35 MG-MCG tablet Take 1 tablet by  mouth daily.     ibuprofen (ADVIL) 800 MG tablet Take 1 tablet (800 mg total) by mouth every 8 (eight) hours as needed. (Patient not taking: Reported on 05/15/2023) 30 tablet 1   valACYclovir (VALTREX) 500 MG tablet Take 1 tablet (500 mg total) by mouth every 12 (twelve) hours. Take for recurrence of symptoms. Take as soon as possible within 24 hours of symptom onset. (Patient not taking: Reported on 05/15/2023) 6 tablet 5   Vitamin D, Ergocalciferol, (DRISDOL) 1.25 MG (50000 UNIT) CAPS capsule Take 1 capsule (50,000 Units total) by mouth every 7 (seven) days. (Patient not taking: Reported on 05/15/2023) 20 capsule 1   Zinc Oxide 40 % PSTE Apply to affected area up to TID AS NEEDED (Patient not taking: Reported on 05/15/2023) 28 g 1   No facility-administered medications prior to visit.    No Known Allergies  ROS Review of Systems  Constitutional:  Negative for chills and fever.  Eyes:  Negative for visual disturbance.  Respiratory:  Negative for chest tightness and shortness of breath.   Neurological:  Negative for dizziness and headaches.      Objective:    Physical Exam HENT:     Head: Normocephalic.     Mouth/Throat:     Mouth:  Mucous membranes are moist.  Cardiovascular:     Rate and Rhythm: Normal rate.     Heart sounds: Normal heart sounds.  Pulmonary:     Effort: Pulmonary effort is normal.     Breath sounds: Normal breath sounds.  Neurological:     Mental Status: She is alert.     BP 118/79   Pulse 76   Resp 15   Ht 5\' 5"  (1.651 m)   Wt 174 lb 12.8 oz (79.3 kg)   SpO2 98%   BMI 29.09 kg/m  Wt Readings from Last 3 Encounters:  05/15/23 174 lb 12.8 oz (79.3 kg)  12/13/22 163 lb 9.6 oz (74.2 kg)  06/25/22 160 lb 0.6 oz (72.6 kg)    Lab Results  Component Value Date   TSH 3.280 12/13/2022   Lab Results  Component Value Date   WBC 4.9 12/13/2022   HGB 12.0 12/13/2022   HCT 37.2 12/13/2022   MCV 84 12/13/2022   PLT 155 12/13/2022   Lab Results   Component Value Date   NA 137 12/13/2022   K 4.2 12/13/2022   CO2 21 12/13/2022   GLUCOSE 85 12/13/2022   BUN 10 12/13/2022   CREATININE 0.68 12/13/2022   BILITOT 0.5 12/13/2022   ALKPHOS 40 (L) 12/13/2022   AST 14 12/13/2022   ALT 11 12/13/2022   PROT 7.0 12/13/2022   ALBUMIN 4.1 12/13/2022   CALCIUM 9.0 12/13/2022   EGFR 124 12/13/2022   Lab Results  Component Value Date   CHOL 170 12/13/2022   Lab Results  Component Value Date   HDL 50 12/13/2022   Lab Results  Component Value Date   LDLCALC 107 (H) 12/13/2022   Lab Results  Component Value Date   TRIG 65 12/13/2022   Lab Results  Component Value Date   CHOLHDL 3.4 12/13/2022   Lab Results  Component Value Date   HGBA1C 5.4 12/13/2022      Assessment & Plan:  Encounter for immunization Assessment & Plan: Patient educated on CDC recommendation for the vaccine. Verbal consent was obtained from the patient, vaccine administered by nurse, no sign of adverse reactions noted at this time. Patient education on arm soreness and use of tylenol or ibuprofen for this patient  was discussed. Patient educated on the signs and symptoms of adverse effect and advise to contact the office if they occur.   Orders: -     Flu vaccine trivalent PF, 6mos and older(Flulaval,Afluria,Fluarix,Fluzone)  Vitamin D deficiency Assessment & Plan: No reports of fatigue, bine pain, muscle weakness and mood changes Pending vit d levels  Orders: -     VITAMIN D 25 Hydroxy (Vit-D Deficiency, Fractures)  IFG (impaired fasting glucose) -     Hemoglobin A1c  TSH (thyroid-stimulating hormone deficiency) -     TSH + free T4  Other hyperlipidemia -     Lipid panel -     CMP14+EGFR -     CBC with Differential/Platelet  Note: This chart has been completed using Engineer, civil (consulting) software, and while attempts have been made to ensure accuracy, certain words and phrases may not be transcribed as intended.    Follow-up: No  follow-ups on file.   Chelsea Laroche, FNP

## 2023-05-15 NOTE — Assessment & Plan Note (Signed)
No reports of fatigue, bine pain, muscle weakness and mood changes Pending vit d levels

## 2023-05-15 NOTE — Progress Notes (Deleted)
Established Patient Office Visit  Subjective:  Patient ID: Chelsea Serrano, female    DOB: [birth year]  Age: 27 y.o. MRN: [id number]  CC:  Chief Complaint  Patient presents with   Follow-up    Has a wellness program form she needs completed     HPI Chelsea Serrano is a 27 y.o. female with past medical history of *** presents for f/u of *** chronic medical conditions.  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   Migraine     Past Surgical History:  Procedure Laterality Date   NO PAST SURGERIES      Family History  Problem Relation Age of Onset   Healthy Mother    Healthy Father    Cancer Maternal Grandmother    Cancer Paternal Grandmother     Social History   Socioeconomic History   Marital status: Single    Spouse name: Not on file   Number of children: 0   Years of education: Not on file   Highest education level: Not on file  Occupational History   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  Tobacco Use   Smoking status: Never   Smokeless tobacco: Never  Vaping Use   Vaping status: Never Used  Substance and Sexual Activity   Alcohol use: No   Drug use: No   Sexual activity: Yes    Birth control/protection: Pill  Other Topics Concern   Not on file  Social History Narrative   Not on file   Social Determinants of Health   Financial Resource Strain: Not on file  Food Insecurity: Not on file  Transportation Needs: Not on file  Physical Activity: Not on file  Stress: Not on file  Social Connections: Not on file  Intimate Partner Violence: Not on file    Outpatient Medications Prior to Visit  Medication Sig Dispense Refill   SPRINTEC 28 0.25-35 MG-MCG tablet Take 1 tablet by mouth daily.     ibuprofen (ADVIL) 800 MG tablet Take 1  tablet (800 mg total) by mouth every 8 (eight) hours as needed. (Patient not taking: Reported on 05/15/2023) 30 tablet 1   valACYclovir (VALTREX) 500 MG tablet Take 1 tablet (500 mg total) by mouth every 12 (twelve) hours. Take for recurrence of symptoms. Take as soon as possible within 24 hours of symptom onset. (Patient not taking: Reported on 05/15/2023) 6 tablet 5   Vitamin D, Ergocalciferol, (DRISDOL) 1.25 MG (50000 UNIT) CAPS capsule Take 1 capsule (50,000 Units total) by mouth every 7 (seven) days. (Patient not taking: Reported on 05/15/2023) 20 capsule 1   Zinc Oxide 40 % PSTE Apply to affected area up to TID AS NEEDED (Patient not taking: Reported on 05/15/2023) 28 g 1   No facility-administered medications prior to visit.    No Known Allergies  ROS Review of Systems    Objective:    Physical Exam  BP 118/79   Pulse 76   Resp 15   Ht 5\' 5"  (1.651 m)   Wt 174 lb 12.8 oz (79.3 kg)   SpO2 98%   BMI 29.09 kg/m  Wt Readings from Last 3 Encounters:  05/15/23 174 lb 12.8 oz (79.3 kg)  12/13/22 163 lb 9.6 oz (74.2 kg)  06/25/22 160 lb 0.6 oz (72.6  kg)    Lab Results  Component Value Date   TSH 3.280 12/13/2022   Lab Results  Component Value Date   WBC 4.9 12/13/2022   HGB 12.0 12/13/2022   HCT 37.2 12/13/2022   MCV 84 12/13/2022   PLT 155 12/13/2022   Lab Results  Component Value Date   NA 137 12/13/2022   K 4.2 12/13/2022   CO2 21 12/13/2022   GLUCOSE 85 12/13/2022   BUN 10 12/13/2022   CREATININE 0.68 12/13/2022   BILITOT 0.5 12/13/2022   ALKPHOS 40 (L) 12/13/2022   AST 14 12/13/2022   ALT 11 12/13/2022   PROT 7.0 12/13/2022   ALBUMIN 4.1 12/13/2022   CALCIUM 9.0 12/13/2022   EGFR 124 12/13/2022   Lab Results  Component Value Date   CHOL 170 12/13/2022   Lab Results  Component Value Date   HDL 50 12/13/2022   Lab Results  Component Value Date   LDLCALC 107 (H) 12/13/2022   Lab Results  Component Value Date   TRIG 65 12/13/2022   Lab  Results  Component Value Date   CHOLHDL 3.4 12/13/2022   Lab Results  Component Value Date   HGBA1C 5.4 12/13/2022      Assessment & Plan:  There are no diagnoses linked to this encounter.  Follow-up: No follow-ups on file.   Gilmore Laroche, FNP

## 2023-05-15 NOTE — Patient Instructions (Addendum)
I appreciate the opportunity to provide care to you today!    Follow up:  1 year for CPE after Jun 27 2023  Labs: please stop by the lab today to get your blood drawn (CBC, CMP, TSH, Lipid profile, HgA1c, Vit D)   Thank you for getting your flu vaccine today   Attached with your AVS, you will find valuable resources for self-education. I highly recommend dedicating some time to thoroughly examine them.   Please continue to a heart-healthy diet and increase your physical activities. Try to exercise for at least five days a week.    It was a pleasure to see you and I look forward to continuing to work together on your health and well-being. Please do not hesitate to call the office if you need care or have questions about your care.  In case of emergency, please visit the Emergency Department for urgent care, or contact our clinic at [phone] to schedule an appointment. We're here to help you!   Have a wonderful day and week. With Gratitude, Gilmore Laroche MSN, FNP-BC

## 2023-05-16 LAB — CMP14+EGFR
ALT: 13 [IU]/L (ref 0–32)
AST: 18 [IU]/L (ref 0–40)
Albumin: 4.1 g/dL (ref 4.0–5.0)
Alkaline Phosphatase: 49 [IU]/L (ref 44–121)
BUN/Creatinine Ratio: 12 (ref 9–23)
BUN: 8 mg/dL (ref 6–20)
Bilirubin Total: 0.8 mg/dL (ref 0.0–1.2)
CO2: 22 mmol/L (ref 20–29)
Calcium: 8.9 mg/dL (ref 8.7–10.2)
Chloride: 104 mmol/L (ref 96–106)
Creatinine, Ser: 0.66 mg/dL (ref 0.57–1.00)
Globulin, Total: 3.3 g/dL (ref 1.5–4.5)
Glucose: 90 mg/dL (ref 70–99)
Potassium: 4 mmol/L (ref 3.5–5.2)
Sodium: 139 mmol/L (ref 134–144)
Total Protein: 7.4 g/dL (ref 6.0–8.5)
eGFR: 124 mL/min/{1.73_m2} (ref >59)

## 2023-05-16 LAB — LIPID PANEL
Chol/HDL Ratio: 3.8 ratio (ref 0.0–4.4)
Cholesterol, Total: 196 mg/dL (ref 100–199)
HDL: 52 mg/dL (ref >39)
LDL Chol Calc (NIH): 133 mg/dL — ABNORMAL HIGH (ref 0–99)
Triglycerides: 62 mg/dL (ref 0–149)
VLDL Cholesterol Cal: 11 mg/dL (ref 5–40)

## 2023-05-16 LAB — CBC WITH DIFFERENTIAL/PLATELET
Basophils Absolute: 0 10*3/uL (ref 0.0–0.2)
Basos: 1 %
EOS (ABSOLUTE): 0 10*3/uL (ref 0.0–0.4)
Eos: 0 %
Hematocrit: 37.5 % (ref 34.0–46.6)
Hemoglobin: 12.1 g/dL (ref 11.1–15.9)
Immature Grans (Abs): 0 10*3/uL (ref 0.0–0.1)
Immature Granulocytes: 0 %
Lymphocytes Absolute: 1.5 10*3/uL (ref 0.7–3.1)
Lymphs: 26 %
MCH: 27.8 pg (ref 26.6–33.0)
MCHC: 32.3 g/dL (ref 31.5–35.7)
MCV: 86 fL (ref 79–97)
Monocytes Absolute: 0.5 10*3/uL (ref 0.1–0.9)
Monocytes: 9 %
Neutrophils Absolute: 3.6 10*3/uL (ref 1.4–7.0)
Neutrophils: 64 %
Platelets: 147 10*3/uL — ABNORMAL LOW (ref 150–450)
RBC: 4.35 x10E6/uL (ref 3.77–5.28)
RDW: 12.4 % (ref 11.7–15.4)
WBC: 5.7 10*3/uL (ref 3.4–10.8)

## 2023-05-16 LAB — TSH+FREE T4
Free T4: 1.13 ng/dL (ref 0.82–1.77)
TSH: 1.81 u[IU]/mL (ref 0.450–4.500)

## 2023-05-16 LAB — HEMOGLOBIN A1C
Est. average glucose Bld gHb Est-mCnc: 103 mg/dL
Hgb A1c MFr Bld: 5.2 % (ref 4.8–5.6)

## 2023-05-16 LAB — VITAMIN D 25 HYDROXY (VIT D DEFICIENCY, FRACTURES): Vit D, 25-Hydroxy: 20.7 ng/mL — ABNORMAL LOW (ref 30.0–100.0)

## 2023-05-20 ENCOUNTER — Other Ambulatory Visit: Payer: Self-pay | Admitting: Family Medicine

## 2023-05-20 DIAGNOSIS — E559 Vitamin D deficiency, unspecified: Secondary | ICD-10-CM

## 2023-05-20 MED ORDER — VITAMIN D (ERGOCALCIFEROL) 1.25 MG (50000 UNIT) PO CAPS: 50000.0000 [IU] | ORAL_CAPSULE | ORAL | 1 refills | Status: AC

## 2023-05-20 NOTE — Progress Notes (Signed)
Please encourage the patient to continue taking her weekly vitamin D supplement, as her vitamin D levels have improved but are still not within the normal range. A refill of her weekly vitamin D supplement has been sent to her pharmacy. Her LDL cholesterol is elevated, and I want her LDL to be less than 100. I recommend decreasing her intake of greasy, fatty, and starchy foods, along with increasing physical activity. All other labs are stable

## 2023-07-02 ENCOUNTER — Ambulatory Visit (INDEPENDENT_AMBULATORY_CARE_PROVIDER_SITE_OTHER): Payer: BC Managed Care – PPO | Admitting: Family Medicine

## 2023-07-02 ENCOUNTER — Encounter: Payer: Self-pay | Admitting: Family Medicine

## 2023-07-02 VITALS — BP 116/83 | HR 70 | Ht 65.0 in | Wt 170.1 lb

## 2023-07-02 DIAGNOSIS — Z0001 Encounter for general adult medical examination with abnormal findings: Secondary | ICD-10-CM | POA: Diagnosis not present

## 2023-07-02 NOTE — Progress Notes (Signed)
Complete physical exam  Patient: Chelsea Serrano   DOB: [birth year]   27 y.o. Female  MRN: [id number]  Subjective:    Chief Complaint  Patient presents with   Annual Exam    Cpe, has form to have completed for physical.    Chelsea Serrano is a 27 y.o. female who presents today for a complete physical exam. She reports consuming a general diet. The patient does not participate in regular exercise at present. She generally feels well. She reports sleeping well. She does not have additional problems to discuss today.    Most recent fall risk assessment:    07/02/2023    8:13 AM  Fall Risk   Falls in the past year? 0  Number falls in past yr: 0  Injury with Fall? 0  Risk for fall due to : No Fall Risks  Follow up Falls evaluation completed     Most recent depression screenings:    07/02/2023    8:14 AM 05/15/2023    8:54 AM  PHQ 2/9 Scores  PHQ - 2 Score 0 0  PHQ- 9 Score 0     Dental: No current dental problems and Last dental visit: 04/2023  Patient Active Problem List   Diagnosis Date Noted   Encounter for immunization 05/15/2023   Vitamin D deficiency 05/15/2023   Right wrist pain 12/13/2022   Need for immunization against influenza 06/25/2022   Overweight (BMI 25.0-29.9) 12/25/2021   Cervical cancer screening 07/18/2021   Cerumen impaction 07/18/2021   Hyperbilirubinemia 07/18/2021   HLD (hyperlipidemia) 07/18/2021   Encounter for general adult medical examination with abnormal findings 04/20/2021   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   SPRINTEC 28 0.25-35 MG-MCG tablet Take 1 tablet by mouth daily.   valACYclovir (VALTREX) 500 MG tablet Take 1 tablet (500 mg total) by mouth every 12 (twelve) hours. Take for recurrence of symptoms. Take as soon as possible within 24 hours of symptom onset.   Vitamin D, Ergocalciferol, (DRISDOL) 1.25 MG (50000 UNIT) CAPS capsule Take 1 capsule (50,000 Units total) by mouth every 7 (seven) days.   Zinc Oxide 40 % PSTE Apply to affected area up to TID AS NEEDED   No facility-administered medications prior to visit.    Review of Systems  Constitutional:  Negative for fever.  HENT:  Negative for congestion and ear pain.   Eyes:  Negative for blurred vision and double vision.  Respiratory:  Negative for cough and shortness of breath.   Cardiovascular:  Negative for chest pain and palpitations.  Gastrointestinal:  Negative for abdominal pain, constipation and diarrhea.  Genitourinary:  Negative for frequency and urgency.  Musculoskeletal:  Negative for back pain and joint pain.  Skin:  Negative for itching.  Neurological:  Negative for dizziness and headaches.  Endo/Heme/Allergies:  Negative for  polydipsia.  Psychiatric/Behavioral:  Negative for depression and suicidal ideas.        Objective:    BP 116/83   Pulse 70   Ht 5\' 5"  (1.651 m)   Wt 170 lb 1.9 oz (77.2 kg)   SpO2 98%   BMI 28.31 kg/m  BP Readings from Last 3 Encounters:  07/02/23 116/83  05/15/23 118/79  01/14/23 124/81   Wt Readings from Last 3 Encounters:  07/02/23 170 lb 1.9 oz (77.2 kg)  05/15/23 174 lb 12.8 oz (79.3 kg)  12/13/22 163 lb 9.6 oz (74.2 kg)   SpO2 Readings from Last 3 Encounters:  07/02/23 98%  05/15/23 98%  01/14/23 98%      Physical Exam HENT:     Head: Normocephalic.     Left Ear: External ear normal.      Mouth/Throat:     Mouth: Mucous membranes are moist.  Eyes:     Extraocular Movements: Extraocular movements intact.     Pupils: Pupils are equal, round, and reactive to light.  Cardiovascular:     Rate and Rhythm: Normal rate and regular rhythm.     Heart sounds: No murmur heard. Pulmonary:     Effort: Pulmonary effort is normal.     Breath sounds: Normal breath sounds.  Abdominal:     Palpations: Abdomen is soft.     Tenderness: There is no right CVA tenderness or left CVA tenderness.  Musculoskeletal:     Right lower leg: No edema.     Left lower leg: No edema.  Skin:    Findings: No lesion or rash.  Neurological:     Mental Status: She is alert and oriented to person, place, and time.     GCS: GCS eye subscore is 4. GCS verbal subscore is 5. GCS motor subscore is 6.     Cranial Nerves: No facial asymmetry.     Sensory: No sensory deficit.     Motor: No weakness.     Coordination: Coordination normal. Finger-Nose-Finger Test normal.     Gait: Gait normal.  Psychiatric:        Judgment: Judgment normal.     No results found for any visits on 07/02/23. Last CBC Lab Results  Component Value Date   WBC 5.7 05/15/2023   HGB 12.1 05/15/2023   HCT 37.5 05/15/2023   MCV 86 05/15/2023   MCH 27.8 05/15/2023   RDW 12.4 05/15/2023   PLT 147 (L) 05/15/2023   Last metabolic panel Lab Results  Component Value Date   GLUCOSE 90 05/15/2023   NA 139 05/15/2023   K 4.0 05/15/2023   CL 104 05/15/2023   CO2 22 05/15/2023   BUN 8 05/15/2023   CREATININE 0.66 05/15/2023   EGFR 124 05/15/2023   CALCIUM 8.9 05/15/2023   PROT 7.4 05/15/2023   ALBUMIN 4.1 05/15/2023   LABGLOB 3.3 05/15/2023   AGRATIO 1.4 12/13/2022   BILITOT 0.8 05/15/2023   ALKPHOS 49 05/15/2023   AST 18 05/15/2023   ALT 13 05/15/2023   Last lipids Lab Results  Component Value Date   CHOL 196 05/15/2023   HDL 52 05/15/2023   LDLCALC 133 (H) 05/15/2023   TRIG 62 05/15/2023   CHOLHDL 3.8 05/15/2023    Last hemoglobin A1c Lab Results  Component Value Date   HGBA1C 5.2 05/15/2023   Last thyroid functions Lab Results  Component Value Date   TSH 1.810 05/15/2023   Last vitamin D Lab Results  Component Value Date  VD25OH 20.7 (L) 05/15/2023   Last vitamin B12 and Folate No results found for: "VITAMINB12", "FOLATE"      Assessment & Plan:    Routine Health Maintenance and Physical Exam  Immunization History  Administered Date(s) Administered   DTaP 06/06/1997, 08/12/1997, 10/13/1997, 07/26/1998, 11/11/2001   HIB (PRP-OMP) 06/06/1997, 08/12/1997, 10/13/1997, 05/16/1998   HPV 9-valent 11/21/2015   HPV Quadrivalent 07/21/2013, 07/28/2014   Hepatitis A 05/03/2010, 05/09/2011   Hepatitis B 04/22/1997, 05/23/1997, 10/13/1997   IPV 06/06/1997, 08/12/1997, 10/13/1997, 11/11/2001   Influenza, Seasonal, Injecte, Preservative Fre 05/15/2023   Influenza,Quad,Nasal, Live 07/21/2013, 07/28/2014   Influenza,inj,Quad PF,6+ Mos 04/20/2021, 06/25/2022   MMR 05/16/1998, 11/11/2001   Meningococcal Conjugate 02/13/2009, 07/21/2013   PPD Test 11/17/2019   Tdap 02/13/2009, 07/19/2021   Varicella 05/16/1998, 05/09/2011    Health Maintenance  Topic Date Due   COVID-19 Vaccine (1 - 2023-24 season) Never done   Cervical Cancer Screening (Pap smear)  07/18/2024   DTaP/Tdap/Td (8 - Td or Tdap) 07/20/2031   INFLUENZA VACCINE  Completed   HPV VACCINES  Completed   Hepatitis C Screening  Completed   HIV Screening  Completed    Discussed health benefits of physical activity, and encouraged her to engage in regular exercise appropriate for her age and condition.  Encounter for general adult medical examination with abnormal findings Assessment & Plan: Physical exam as documented Discussed heart-healthy diet  Encouraged to Exercise: If you are able: 30 -60 minutes a day ,4 days a week, or 150 minutes a week. The longer the better. Combine stretch, strength, and aerobic activities Encourage  to eat whole Food, Plant Predominant Nutrition is highly recommended: Eat Plenty of vegetables, Mushrooms, fruits, Legumes, Whole Grains, Nuts, seeds in lieu of processed meats, processed snacks/pastries red meat, poultry, eggs.  Will f/u in 1 year for CPE      Note: This chart has been completed using Engineer, civil (consulting) software, and while attempts have been made to ensure accuracy, certain words and phrases may not be transcribed as intended.    Return in about 1 year (around 07/01/2024) for CPE.     Gilmore Laroche, FNP

## 2023-07-02 NOTE — Assessment & Plan Note (Signed)

## 2023-07-02 NOTE — Patient Instructions (Signed)
I appreciate the opportunity to provide care to you today!    Follow up:  1 year for CPE   Attached with your AVS, you will find valuable resources for self-education. I highly recommend dedicating some time to thoroughly examine them.   Please continue to a heart-healthy diet and increase your physical activities. Try to exercise for at least five days a week.    It was a pleasure to see you and I look forward to continuing to work together on your health and well-being. Please do not hesitate to call the office if you need care or have questions about your care.  In case of emergency, please visit the Emergency Department for urgent care, or contact our clinic at [phone] to schedule an appointment. We're here to help you!   Have a wonderful day and week. With Gratitude, Gilmore Laroche MSN, FNP-BC

## 2023-09-18 ENCOUNTER — Ambulatory Visit
Admission: RE | Admit: 2023-09-18 | Discharge: 2023-09-18 | Disposition: A | Discharge status: Home / Self Care | Payer: Self-pay | Source: Ambulatory Visit | Attending: Family Medicine | Admitting: Family Medicine

## 2023-09-18 ENCOUNTER — Other Ambulatory Visit: Payer: Self-pay

## 2023-09-18 VITALS — BP 127/84 | HR 74 | Temp 98.3°F | Resp 20

## 2023-09-18 DIAGNOSIS — J0100 Acute maxillary sinusitis, unspecified: Secondary | ICD-10-CM

## 2023-09-18 LAB — POC COVID19/FLU A&B COMBO
Covid Antigen, POC: NEGATIVE
Influenza A Antigen, POC: NEGATIVE
Influenza B Antigen, POC: NEGATIVE

## 2023-09-18 MED ORDER — AMOXICILLIN-POT CLAVULANATE 875-125 MG PO TABS: 1.0000 | ORAL_TABLET | Freq: Two times a day (BID) | ORAL | 0 refills | Status: AC

## 2023-09-18 MED ORDER — FLUTICASONE PROPIONATE 50 MCG/ACT NA SUSP: 1.0000 | Freq: Two times a day (BID) | NASAL | 2 refills | Status: AC

## 2023-09-18 NOTE — ED Triage Notes (Signed)
 Pt reports nasal congestion, headache since last week. Denies any known fevers.

## 2023-09-18 NOTE — ED Provider Notes (Signed)
 [facility]    CSN: [id number] Arrival date & time: 09/18/23  1758      History   Chief Complaint Chief Complaint  Patient presents with   Nasal Congestion    Nose has been stopped up since last Thursday on February 20th. I have been experiencing green and bloody snot coming from my nose. Started taking Alka-Seltzer for the first 3 days. Then started using a saline nasal spray. - Entered by patient    HPI Chelsea Serrano is a 28 y.o. female.   Patient presenting today with about a week of progressively worsening nasal congestion, sinus pain and pressure, sinus headache, mild cough.  Denies fever, chills.,  Shortness of breath, abdominal pain, vomiting, diarrhea.  Taking Alka-Seltzer cold and flu with no relief.  Has also been using nasal saline.    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   Migraine     Patient Active Problem List   Diagnosis Date Noted   Encounter for immunization 05/15/2023   Vitamin D deficiency 05/15/2023   Right wrist pain 12/13/2022   Need for immunization against influenza 06/25/2022   Overweight (BMI 25.0-29.9) 12/25/2021   Cervical cancer screening 07/18/2021   Cerumen impaction 07/18/2021   Hyperbilirubinemia 07/18/2021   HLD (hyperlipidemia) 07/18/2021   Encounter for general adult medical examination with abnormal findings 04/20/2021   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      Home Medications    Prior to Admission medications   Medication Sig Start Date End Date Taking? Authorizing Provider  amoxicillin-clavulanate (AUGMENTIN) 875-125 MG tablet Take 1 tablet by mouth every 12  (twelve) hours. 09/18/23  Yes Particia Nearing, PA-C  fluticasone [facility]) 50 MCG/ACT nasal spray Place 1 spray into both nostrils 2 (two) times daily. 09/18/23  Yes Particia Nearing, PA-C  ibuprofen (ADVIL) 800 MG tablet Take 1 tablet (800 mg total) by mouth every 8 (eight) hours as needed. 12/13/22   Gilmore Laroche, FNP  SPRINTEC 28 0.25-35 MG-MCG tablet Take 1 tablet by mouth daily. 10/30/20   [provider]  valACYclovir (VALTREX) 500 MG tablet Take 1 tablet (500 mg total) by mouth every 12 (twelve) hours. Take for recurrence of symptoms. Take as soon as possible within 24 hours of symptom onset. 04/20/21   Heather Roberts, NP  Vitamin D, Ergocalciferol, (DRISDOL) 1.25 MG (50000 UNIT) CAPS capsule Take 1 capsule (50,000 Units total) by mouth every 7 (seven) days. 05/20/23   Gilmore Laroche, FNP  Zinc Oxide 40 % PSTE Apply to affected area up to TID AS NEEDED 04/20/21   Heather Roberts, NP    Family History Family History  Problem Relation Age of Onset   Healthy Mother    Healthy Father    Cancer Maternal Grandmother    Cancer Paternal Grandmother     Social History Social History   Tobacco Use   Smoking status: Never   Smokeless tobacco: Never  Vaping Use   Vaping status: Never Used  Substance Use Topics   Alcohol use: No   Drug use: No     Allergies  Patient has no known allergies.   Review of Systems Review of Systems Per HPI  Physical Exam Triage Vital Signs ED Triage Vitals  Encounter Vitals Group     BP 09/18/23 1833 127/84     Systolic BP Percentile --      Diastolic BP Percentile --      Pulse Rate 09/18/23 1833 74     Resp 09/18/23 1833 20     Temp 09/18/23 1833 98.3 F (36.8 C)     Temp Source 09/18/23 1833 Oral     SpO2 09/18/23 1833 98 %     Weight --      Height --      Head Circumference --      Peak Flow --      Pain Score 09/18/23 1832 0     Pain Loc --      Pain Education --      Exclude from Growth Chart --    No data  found.  Updated Vital Signs BP 127/84 (BP Location: Right Arm)   Pulse 74   Temp 98.3 F (36.8 C) (Oral)   Resp 20   LMP 09/17/2023 (Approximate)   SpO2 98%   Visual Acuity Right Eye Distance:   Left Eye Distance:   Bilateral Distance:    Right Eye Near:   Left Eye Near:    Bilateral Near:     Physical Exam Vitals and nursing note reviewed.  Constitutional:      Appearance: Normal appearance.  HENT:     Head: Atraumatic.     Right Ear: Tympanic membrane and external ear normal.     Left Ear: Tympanic membrane and external ear normal.     Nose: Congestion present.     Mouth/Throat:     Mouth: Mucous membranes are moist.     Pharynx: Posterior oropharyngeal erythema present.  Eyes:     Extraocular Movements: Extraocular movements intact.     Conjunctiva/sclera: Conjunctivae normal.  Cardiovascular:     Rate and Rhythm: Normal rate and regular rhythm.     Heart sounds: Normal heart sounds.  Pulmonary:     Effort: Pulmonary effort is normal.     Breath sounds: Normal breath sounds. No wheezing or rales.  Musculoskeletal:        General: Normal range of motion.     Cervical back: Normal range of motion and neck supple.  Skin:    General: Skin is warm and dry.  Neurological:     Mental Status: She is alert and oriented to person, place, and time.  Psychiatric:        Mood and Affect: Mood normal.        Thought Content: Thought content normal.      UC Treatments / Results  Labs (all labs ordered are listed, but only abnormal results are displayed) Labs Reviewed  POC COVID19/FLU A&B COMBO    EKG   Radiology No results found.  Procedures Procedures (including critical care time)  Medications Ordered in UC Medications - No data to display  Initial Impression / Assessment and Plan / UC Course  I have reviewed the triage vital signs and the nursing notes.  Pertinent labs & imaging results that were available during my care of the patient were reviewed  by me and considered in my medical decision making (see chart for details).     Given duration worsening course, will treat with Augmentin, Flonase, saline sinus rinses, supportive over-the-counter medications and home care.  Return for worsening symptoms. Final Clinical Impressions(s) / UC Diagnoses   Final diagnoses:  Acute maxillary sinusitis, recurrence not specified   Discharge Instructions   None    ED Prescriptions     Medication Sig Dispense Auth. Provider   amoxicillin-clavulanate (AUGMENTIN) 875-125 MG tablet Take 1 tablet by mouth every 12 (twelve) hours. 14 tablet Particia Nearing, PA-C   fluticasone [facility] 50 MCG/ACT nasal spray Place 1 spray into both nostrils 2 (two) times daily. 16 g Particia Nearing, [place]      PDMP not reviewed this encounter.   Particia Nearing, [place] 09/18/23 1933

## 2023-11-10 ENCOUNTER — Ambulatory Visit
Admission: EM | Admit: 2023-11-10 | Discharge: 2023-11-10 | Disposition: A | Discharge status: Home / Self Care | Attending: Nurse Practitioner | Admitting: Nurse Practitioner

## 2023-11-10 DIAGNOSIS — M25562 Pain in left knee: Secondary | ICD-10-CM | POA: Diagnosis not present

## 2023-11-10 NOTE — ED Triage Notes (Signed)
 Pt reports left knee pain, pt states the knee shifted out places on yesterday and pain has been present since. Pt is aware we do not have x-ray on sight.

## 2023-11-10 NOTE — Discharge Instructions (Signed)
 Continue over-the-counter ibuprofen  or Tylenol for pain or discomfort. RICE therapy rest, ice, compression, and elevation until your symptoms improve.  Apply ice for 20 minutes, remove for 1 hour, then repeat as often as possible.  This will help with pain and swelling. Gentle range of motion exercises of the left knee to help improve mobility of the joint.  I have provided exercises for you to perform at least 2-3 times daily. A knee brace/sleeve has been provided to allow for additional compression and support.  Wear this device when you are engaged in prolonged or strenuous activity. If symptoms do not improve within the next 2 to 4 weeks, recommend following up with orthopedics.   Follow-up as needed.

## 2023-11-10 NOTE — ED Provider Notes (Signed)
 [facility]    CSN: [id number] Arrival date & time: 11/10/23  0803      History   Chief Complaint No chief complaint on file.   HPI Chelsea Serrano is a 28 y.o. female.   The history is provided by the patient.   Patient presents for a 1 day history of left knee pain.  Patient states she twisted "wrong" and since that time has had pain in the left knee.  She reports that she has pain with ambulation, and states that the knee feels "weak" at times.  She also endorses swelling of the left knee.  Patient denies numbness, tingling, radiation of pain, or the inability to bear weight.  Patient states that she did take ibuprofen  and ice the knee, which has helped with the pain.  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   Migraine     Patient Active Problem List   Diagnosis Date Noted   Encounter for immunization 05/15/2023   Vitamin D  deficiency 05/15/2023   Right wrist pain 12/13/2022   Need for immunization against influenza 06/25/2022   Overweight (BMI 25.0-29.9) 12/25/2021   Cervical cancer screening 07/18/2021   Cerumen impaction 07/18/2021   Hyperbilirubinemia 07/18/2021   HLD (hyperlipidemia) 07/18/2021   Encounter for general adult medical examination with abnormal findings 04/20/2021   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      Home Medications    Prior to Admission medications   Medication Sig Start Date End Date Taking? Authorizing Provider  amoxicillin -clavulanate (AUGMENTIN ) 875-125 MG tablet Take 1 tablet by mouth every 12 (twelve) hours. 09/18/23   Corbin Dess, PA-C  fluticasone  (FLONASE ) 50  MCG/ACT nasal spray Place 1 spray into both nostrils 2 (two) times daily. 09/18/23   Corbin Dess, PA-C  ibuprofen  (ADVIL ) 800 MG tablet Take 1 tablet (800 mg total) by mouth every 8 (eight) hours as needed. 12/13/22   Zarwolo, Gloria, FNP  SPRINTEC 28 0.25-35 MG-MCG tablet Take 1 tablet by mouth daily. 10/30/20   [provider]  valACYclovir  (VALTREX ) 500 MG tablet Take 1 tablet (500 mg total) by mouth every 12 (twelve) hours. Take for recurrence of symptoms. Take as soon as possible within 24 hours of symptom onset. 04/20/21   Artemio Larry, NP  Vitamin D , Ergocalciferol , (DRISDOL ) 1.25 MG (50000 UNIT) CAPS capsule Take 1 capsule (50,000 Units total) by mouth every 7 (seven) days. 05/20/23   Zarwolo, Gloria, FNP  Zinc  Oxide 40 % PSTE Apply to affected area up to TID AS NEEDED 04/20/21   Artemio Larry, NP    Family History Family History  Problem Relation Age of Onset   Healthy Mother    Healthy Father    Cancer Maternal Grandmother    Cancer Paternal Grandmother     Social History Social History   Tobacco Use   Smoking status: Never   Smokeless tobacco: Never  Vaping Use   Vaping status: Never Used  Substance Use Topics   Alcohol use: No   Drug use: No     Allergies   Patient has no known allergies.  Review of Systems Review of Systems Per HPI  Physical Exam Triage Vital Signs ED Triage Vitals  Encounter Vitals Group     BP 11/10/23 0829 119/75     Systolic BP Percentile --      Diastolic BP Percentile --      Pulse Rate 11/10/23 0829 78     Resp 11/10/23 0829 18     Temp 11/10/23 0829 98 F (36.7 C)     Temp Source 11/10/23 0829 Oral     SpO2 11/10/23 0829 98 %     Weight --      Height --      Head Circumference --      Peak Flow --      Pain Score 11/10/23 0831 2     Pain Loc --      Pain Education --      Exclude from Growth Chart --    No data found.  Updated Vital Signs BP 119/75 (BP Location: Right Arm)   Pulse 78   Temp 98  F (36.7 C) (Oral)   Resp 18   SpO2 98%   Visual Acuity Right Eye Distance:   Left Eye Distance:   Bilateral Distance:    Right Eye Near:   Left Eye Near:    Bilateral Near:     Physical Exam Vitals and nursing note reviewed.  Constitutional:      General: She is not in acute distress.    Appearance: Normal appearance.  HENT:     Head: Normocephalic.  Eyes:     Extraocular Movements: Extraocular movements intact.     Pupils: Pupils are equal, round, and reactive to light.  Pulmonary:     Effort: Pulmonary effort is normal.  Musculoskeletal:     Left knee: Swelling present. No deformity, erythema or ecchymosis. Normal range of motion. No tenderness. Normal pulse.     Comments: Point tenderness noted to the lateral articular cartilage and lateral collateral ligament.  Mild effusion noted to the left knee.  There is no obvious deformity, bruising, or redness present.  Skin:    General: Skin is warm and dry.  Neurological:     General: No focal deficit present.     Mental Status: She is alert and oriented to person, place, and time.  Psychiatric:        Mood and Affect: Mood normal.        Behavior: Behavior normal.      UC Treatments / Results  Labs (all labs ordered are listed, but only abnormal results are displayed) Labs Reviewed - No data to display  EKG   Radiology No results found.  Procedures Procedures (including critical care time)  Medications Ordered in UC Medications - No data to display  Initial Impression / Assessment and Plan / UC Course  I have reviewed the triage vital signs and the nursing notes.  Pertinent labs & imaging results that were available during my care of the patient were reviewed by me and considered in my medical decision making (see chart for details).  Imaging was offered to the patient, patient declined.  Will provide compression sleeve to allow for additional compression and support. Suspect a sprain/strain of the left  knee at a minimum, but cannot rule out other etiology without further imaging.  Supportive care recommendations were provided and discussed with the patient to include RICE therapy, and continuing over-the-counter analgesics.  Patient was given gentle range of motion exercises.  Patient was advised  if symptoms fail to improve, it is recommended that she follow-up with orthopedics for further evaluation.  Patient was in agreement with this plan of care and verbalizes understanding.  All questions were answered.  Patient stable for discharge.  Final Clinical Impressions(s) / UC Diagnoses   Final diagnoses:  Left knee pain, unspecified chronicity     Discharge Instructions      Continue over-the-counter ibuprofen  or Tylenol for pain or discomfort. RICE therapy rest, ice, compression, and elevation until your symptoms improve.  Apply ice for 20 minutes, remove for 1 hour, then repeat as often as possible.  This will help with pain and swelling. Gentle range of motion exercises of the left knee to help improve mobility of the joint.  I have provided exercises for you to perform at least 2-3 times daily. A knee brace/sleeve has been provided to allow for additional compression and support.  Wear this device when you are engaged in prolonged or strenuous activity. If symptoms do not improve within the next 2 to 4 weeks, recommend following up with orthopedics.   Follow-up as needed.     ED Prescriptions   None    PDMP not reviewed this encounter.   Hardy Lia, NP 11/10/23 [phone]

## 2023-12-08 ENCOUNTER — Other Ambulatory Visit: Payer: Self-pay | Admitting: Family Medicine

## 2023-12-08 DIAGNOSIS — M25531 Pain in right wrist: Secondary | ICD-10-CM

## 2024-03-19 ENCOUNTER — Ambulatory Visit
Admission: RE | Admit: 2024-03-19 | Discharge: 2024-03-19 | Disposition: A | Discharge status: Home / Self Care | Source: Ambulatory Visit | Attending: Nurse Practitioner | Admitting: Nurse Practitioner

## 2024-03-19 VITALS — BP 118/77 | HR 83 | Temp 98.6°F | Resp 16

## 2024-03-19 DIAGNOSIS — R198 Other specified symptoms and signs involving the digestive system and abdomen: Secondary | ICD-10-CM

## 2024-03-19 DIAGNOSIS — R12 Heartburn: Secondary | ICD-10-CM

## 2024-03-19 MED ORDER — ALUM & MAG HYDROXIDE-SIMETH 200-200-20 MG/5ML PO SUSP
30.0000 mL | Freq: Once | ORAL | Status: AC
  Administered 2024-03-19: 30 mL via ORAL

## 2024-03-19 MED ORDER — OMEPRAZOLE 20 MG PO CPDR: 20.0000 mg | DELAYED_RELEASE_CAPSULE | Freq: Every day | ORAL | 0 refills | Status: AC

## 2024-03-19 MED ORDER — MYLANTA MAXIMUM STRENGTH 400-400-40 MG/5ML PO SUSP: 15.0000 mL | Freq: Four times a day (QID) | ORAL | 0 refills | Status: AC | PRN

## 2024-03-19 MED ORDER — LIDOCAINE VISCOUS HCL 2 % MT SOLN
15.0000 mL | Freq: Once | OROMUCOSAL | Status: AC
  Administered 2024-03-19: 15 mL via OROMUCOSAL

## 2024-03-19 NOTE — Discharge Instructions (Signed)
 Take medication as prescribed. You may take over-the-counter Tylenol or ibuprofen  as needed for pain, fever, or general discomfort. You may try baking soda and water as needed to help with heartburn. Try to avoid foods that may trigger reflux to include spicy foods, tomato-based foods, dairy, red beets, caffeine, or mint based foods. Try to eat 6 small meals instead of 3 large meals to help with reflux symptoms. You may need to sleep elevated at nighttime while symptoms persist. Make sure you are eating at least 2 to 3 hours before bedtime. If symptoms fail to improve with this treatment, recommend follow-up with your primary care physician for further evaluation. Follow-up as needed.

## 2024-03-19 NOTE — ED Triage Notes (Signed)
 Pt reports she has been having heartburn x 2 days   Trid nothing for relief

## 2024-03-19 NOTE — ED Provider Notes (Signed)
 [facility]    CSN: [id number] Arrival date & time: 03/19/24  1259      History   Chief Complaint Chief Complaint  Patient presents with   Heartburn    I've had heartburn for two days. Think it has something to do with sleep deprivation. - Entered by patient    HPI Chelsea Serrano is a 28 y.o. female.   The history is provided by the patient.   Patient presents for complaints of heartburn that is been present for the past 2 days.  She points to the center of her chest when she asked where her pain is.  She states that the pain has also impacted her sleeping.  Patient states prior to symptoms starting she remembers having fried chicken.  She also endorses belching.  Patient denies fever, chills, abdominal pain, nausea, vomiting, diarrhea, gas, bloating, or bloody stools.  Patient denies prior history of acid reflux.  So far, she has not tried any medications for her symptoms. 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   Migraine     Patient Active Problem List   Diagnosis Date Noted   Encounter for immunization 05/15/2023   Vitamin D  deficiency 05/15/2023   Right wrist pain 12/13/2022   Need for immunization against influenza 06/25/2022   Overweight (BMI 25.0-29.9) 12/25/2021   Cervical cancer screening 07/18/2021   Cerumen impaction 07/18/2021   Hyperbilirubinemia 07/18/2021   HLD (hyperlipidemia) 07/18/2021   Encounter for general adult medical examination with abnormal findings 04/20/2021   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      Home Medications    Prior to Admission medications   Medication Sig Start  Date End Date Taking? Authorizing Provider  alum & mag hydroxide-simeth (MYLANTA MAXIMUM STRENGTH) 400-400-40 MG/5ML suspension Take 15 mLs by mouth every 6 (six) hours as needed for indigestion. 03/19/24  Yes Leath-Warren, Etta PARAS, NP  omeprazole  (PRILOSEC) 20 MG capsule Take 1 capsule (20 mg total) by mouth daily. 03/19/24  Yes Leath-Warren, Etta PARAS, NP  amoxicillin -clavulanate (AUGMENTIN ) 875-125 MG tablet Take 1 tablet by mouth every 12 (twelve) hours. 09/18/23   Stuart Vernell Norris, PA-C  fluticasone  (FLONASE ) 50 MCG/ACT nasal spray Place 1 spray into both nostrils 2 (two) times daily. 09/18/23   Stuart Vernell Norris, PA-C  ibuprofen  (ADVIL ) 800 MG tablet TAKE (1) TABLET BY MOUTH EVERY EIGHT HOURS AS NEEDED. 12/08/23   Zarwolo, Gloria, FNP  SPRINTEC 28 0.25-35 MG-MCG tablet Take 1 tablet by mouth daily. 10/30/20   [provider]  valACYclovir  (VALTREX ) 500 MG tablet Take 1 tablet (500 mg total) by mouth every 12 (twelve) hours. Take for recurrence of symptoms. Take as soon as possible within 24 hours of symptom onset. 04/20/21   Elnor Fairy HERO, NP  Vitamin D , Ergocalciferol , (DRISDOL ) 1.25 MG (50000 UNIT) CAPS capsule Take 1 capsule (50,000 Units total) by mouth every 7 (seven) days. 05/20/23   Zarwolo, Gloria, FNP  Zinc  Oxide 40 % PSTE Apply to affected area up to TID AS NEEDED 04/20/21   Elnor Fairy HERO, NP    Family History Family History  Problem  Relation Age of Onset   Healthy Mother    Healthy Father    Cancer Maternal Grandmother    Cancer Paternal Grandmother     Social History Social History   Tobacco Use   Smoking status: Never   Smokeless tobacco: Never  Vaping Use   Vaping status: Never Used  Substance Use Topics   Alcohol use: No   Drug use: No     Allergies   Patient has no known allergies.   Review of Systems Review of Systems Per HPI  Physical Exam Triage Vital Signs ED Triage Vitals  Encounter Vitals Group     BP 03/19/24 1308 118/77      Girls Systolic BP Percentile --      Girls Diastolic BP Percentile --      Boys Systolic BP Percentile --      Boys Diastolic BP Percentile --      Pulse Rate 03/19/24 1308 83     Resp 03/19/24 1308 16     Temp 03/19/24 1308 98.6 F (37 C)     Temp Source 03/19/24 1308 Oral     SpO2 03/19/24 1308 98 %     Weight --      Height --      Head Circumference --      Peak Flow --      Pain Score 03/19/24 1309 0     Pain Loc --      Pain Education --      Exclude from Growth Chart --    No data found.  Updated Vital Signs BP 118/77 (BP Location: Right Arm)   Pulse 83   Temp 98.6 F (37 C) (Oral)   Resp 16   LMP 03/03/2024 (Approximate)   SpO2 98%   Visual Acuity Right Eye Distance:   Left Eye Distance:   Bilateral Distance:    Right Eye Near:   Left Eye Near:    Bilateral Near:     Physical Exam Vitals and nursing note reviewed.  Constitutional:      General: She is not in acute distress.    Appearance: Normal appearance.  HENT:     Head: Normocephalic.  Eyes:     Extraocular Movements: Extraocular movements intact.     Conjunctiva/sclera: Conjunctivae normal.     Pupils: Pupils are equal, round, and reactive to light.  Cardiovascular:     Rate and Rhythm: Normal rate and regular rhythm.     Pulses: Normal pulses.     Heart sounds: Normal heart sounds.  Pulmonary:     Effort: Pulmonary effort is normal. No respiratory distress.     Breath sounds: Normal breath sounds. No stridor. No wheezing, rhonchi or rales.  Chest:     Chest wall: No tenderness.  Abdominal:     General: Bowel sounds are normal.     Palpations: Abdomen is soft.     Tenderness: There is no abdominal tenderness.  Musculoskeletal:     Cervical back: Normal range of motion.  Skin:    General: Skin is warm and dry.  Neurological:     General: No focal deficit present.     Mental Status: She is alert and oriented to person, place, and time.  Psychiatric:        Mood and Affect: Mood  normal.        Behavior: Behavior normal.      UC Treatments / Results  Labs (all labs ordered are listed, but only abnormal  results are displayed) Labs Reviewed - No data to display  EKG   Radiology No results found.  Procedures Procedures (including critical care time)  Medications Ordered in UC Medications  alum & mag hydroxide-simeth (MAALOX/MYLANTA) 200-200-20 MG/5ML suspension 30 mL (30 mLs Oral Given 03/19/24 1320)  lidocaine  (XYLOCAINE ) 2 % viscous mouth solution 15 mL (15 mLs Mouth/Throat Given 03/19/24 1320)    Initial Impression / Assessment and Plan / UC Course  I have reviewed the triage vital signs and the nursing notes.  Pertinent labs & imaging results that were available during my care of the patient were reviewed by me and considered in my medical decision making (see chart for details).  Patient with a 2-day history of heartburn.  GI cocktail was administered with good improvement of the patient's symptoms.  Symptoms consistent with reflux.  Will start patient on Prilosec 20 mg and Mylanta for possible reflux symptoms.  Supportive care recommendations were provided and discussed with the patient to include over-the-counter analgesics, eating smaller meals, avoiding foods that trigger reflux, and eating at least 2 to 3 hours before bedtime.  Discussed indications with patient regarding follow-up.  Patient was in agreement with this plan of care and verbalizes understanding.  All questions were answered.  Patient stable for discharge.  Work note was provided.  Final Clinical Impressions(s) / UC Diagnoses   Final diagnoses:  Symptoms of gastroesophageal reflux  Heartburn     Discharge Instructions      Take medication as prescribed. You may take over-the-counter Tylenol or ibuprofen  as needed for pain, fever, or general discomfort. You may try baking soda and water as needed to help with heartburn. Try to avoid foods that may trigger reflux to include  spicy foods, tomato-based foods, dairy, red beets, caffeine, or mint based foods. Try to eat 6 small meals instead of 3 large meals to help with reflux symptoms. You may need to sleep elevated at nighttime while symptoms persist. Make sure you are eating at least 2 to 3 hours before bedtime. If symptoms fail to improve with this treatment, recommend follow-up with your primary care physician for further evaluation. Follow-up as needed.     ED Prescriptions     Medication Sig Dispense Auth. Provider   omeprazole  (PRILOSEC) 20 MG capsule Take 1 capsule (20 mg total) by mouth daily. 30 capsule Leath-Warren, Etta PARAS, NP   alum & mag hydroxide-simeth (MYLANTA MAXIMUM STRENGTH) 400-400-40 MG/5ML suspension Take 15 mLs by mouth every 6 (six) hours as needed for indigestion. 355 mL Leath-Warren, Etta PARAS, NP      PDMP not reviewed this encounter.   Gilmer Etta PARAS, NP 03/19/24 1348

## 2024-03-29 DIAGNOSIS — Z3041 Encounter for surveillance of contraceptive pills: Secondary | ICD-10-CM | POA: Diagnosis not present

## 2024-06-20 DIAGNOSIS — Z2839 Other underimmunization status: Secondary | ICD-10-CM | POA: Diagnosis not present

## 2024-06-20 DIAGNOSIS — Z0000 Encounter for general adult medical examination without abnormal findings: Secondary | ICD-10-CM | POA: Diagnosis not present
